# Patient Record
Sex: Female | Born: 1965
Health system: Southern US, Community
[De-identification: ages and names within clinical notes are randomized; demographics above are authoritative.]

## PROBLEM LIST (undated history)

## (undated) DIAGNOSIS — G4700 Insomnia, unspecified: Secondary | ICD-10-CM

## (undated) DIAGNOSIS — F0781 Postconcussional syndrome: Secondary | ICD-10-CM

## (undated) DIAGNOSIS — S069X9A Unspecified intracranial injury with loss of consciousness of unspecified duration, initial encounter: Secondary | ICD-10-CM

## (undated) DIAGNOSIS — H832X9 Labyrinthine dysfunction, unspecified ear: Secondary | ICD-10-CM

## (undated) DIAGNOSIS — I77819 Aortic ectasia, unspecified site: Secondary | ICD-10-CM

## (undated) DIAGNOSIS — E785 Hyperlipidemia, unspecified: Secondary | ICD-10-CM

## (undated) DIAGNOSIS — R112 Nausea with vomiting, unspecified: Secondary | ICD-10-CM

## (undated) DIAGNOSIS — K449 Diaphragmatic hernia without obstruction or gangrene: Secondary | ICD-10-CM

## (undated) DIAGNOSIS — G43909 Migraine, unspecified, not intractable, without status migrainosus: Secondary | ICD-10-CM

## (undated) DIAGNOSIS — K219 Gastro-esophageal reflux disease without esophagitis: Secondary | ICD-10-CM

## (undated) DIAGNOSIS — I10 Essential (primary) hypertension: Secondary | ICD-10-CM

## (undated) DIAGNOSIS — S069XAA Unspecified intracranial injury with loss of consciousness status unknown, initial encounter: Secondary | ICD-10-CM

## (undated) DIAGNOSIS — M5481 Occipital neuralgia: Secondary | ICD-10-CM

## (undated) DIAGNOSIS — G629 Polyneuropathy, unspecified: Secondary | ICD-10-CM

## (undated) DIAGNOSIS — F39 Unspecified mood [affective] disorder: Secondary | ICD-10-CM

## (undated) DIAGNOSIS — F4310 Post-traumatic stress disorder, unspecified: Secondary | ICD-10-CM

## (undated) DIAGNOSIS — H8190 Unspecified disorder of vestibular function, unspecified ear: Secondary | ICD-10-CM

## (undated) DIAGNOSIS — Z9889 Other specified postprocedural states: Secondary | ICD-10-CM

## (undated) DIAGNOSIS — R29898 Other symptoms and signs involving the musculoskeletal system: Secondary | ICD-10-CM

## (undated) DIAGNOSIS — T8859XA Other complications of anesthesia, initial encounter: Secondary | ICD-10-CM

## (undated) DIAGNOSIS — K589 Irritable bowel syndrome without diarrhea: Secondary | ICD-10-CM

## (undated) DIAGNOSIS — G4730 Sleep apnea, unspecified: Secondary | ICD-10-CM

## (undated) DIAGNOSIS — G43109 Migraine with aura, not intractable, without status migrainosus: Secondary | ICD-10-CM

## (undated) HISTORY — DX: Essential (primary) hypertension: I10

## (undated) HISTORY — PX: TONSILLECTOMY: SUR1361

## (undated) HISTORY — DX: Sleep apnea, unspecified: G47.30

## (undated) HISTORY — DX: Migraine with aura, not intractable, without status migrainosus: G43.109

## (undated) HISTORY — PX: COLONOSCOPY: SHX174

## (undated) HISTORY — DX: Unspecified disorder of vestibular function, unspecified ear: H81.90

## (undated) HISTORY — DX: Unspecified intracranial injury with loss of consciousness of unspecified duration, initial encounter: S06.9X9A

## (undated) HISTORY — PX: APPENDECTOMY: SHX54

## (undated) HISTORY — DX: Hyperlipidemia, unspecified: E78.5

## (undated) HISTORY — DX: Unspecified intracranial injury with loss of consciousness status unknown, initial encounter: S06.9XAA

## (undated) HISTORY — DX: Occipital neuralgia: M54.81

## (undated) HISTORY — DX: Diaphragmatic hernia without obstruction or gangrene: K44.9

## (undated) HISTORY — DX: Labyrinthine dysfunction, unspecified ear: H83.2X9

## (undated) HISTORY — DX: Polyneuropathy, unspecified: G62.9

## (undated) HISTORY — DX: Migraine, unspecified, not intractable, without status migrainosus: G43.909

## (undated) HISTORY — DX: Post-traumatic stress disorder, unspecified: F43.10

## (undated) HISTORY — DX: Unspecified mood (affective) disorder: F39

## (undated) HISTORY — DX: Aortic ectasia, unspecified site: I77.819

## (undated) HISTORY — DX: Irritable bowel syndrome, unspecified: K58.9

## (undated) HISTORY — DX: Other symptoms and signs involving the musculoskeletal system: R29.898

## (undated) HISTORY — DX: Postconcussional syndrome: F07.81

## (undated) HISTORY — DX: Gastro-esophageal reflux disease without esophagitis: K21.9

## (undated) HISTORY — PX: OTHER SURGICAL HISTORY: SHX169

## (undated) HISTORY — DX: Insomnia, unspecified: G47.00

## (undated) HISTORY — PX: UPPER GI ENDOSCOPY: SHX6162

---

## 1998-04-23 ENCOUNTER — Emergency Department (HOSPITAL_COMMUNITY): Admission: EM | Admit: 1998-04-23 | Discharge: 1998-04-23 | Payer: Self-pay | Admitting: *Deleted

## 1998-05-25 ENCOUNTER — Emergency Department (HOSPITAL_COMMUNITY): Admission: EM | Admit: 1998-05-25 | Discharge: 1998-05-25 | Payer: Self-pay | Admitting: Emergency Medicine

## 1999-09-10 ENCOUNTER — Encounter: Admission: RE | Admit: 1999-09-10 | Discharge: 1999-10-07 | Payer: Self-pay | Admitting: Internal Medicine

## 1999-09-22 ENCOUNTER — Emergency Department (HOSPITAL_COMMUNITY): Admission: EM | Admit: 1999-09-22 | Discharge: 1999-09-22 | Payer: Self-pay | Admitting: Emergency Medicine

## 1999-09-23 ENCOUNTER — Encounter: Payer: Self-pay | Admitting: Emergency Medicine

## 2000-09-07 ENCOUNTER — Other Ambulatory Visit: Admission: RE | Admit: 2000-09-07 | Discharge: 2000-09-07 | Payer: Self-pay | Admitting: Obstetrics and Gynecology

## 2000-09-30 ENCOUNTER — Ambulatory Visit (HOSPITAL_COMMUNITY): Admission: RE | Admit: 2000-09-30 | Discharge: 2000-09-30 | Payer: Self-pay | Admitting: Obstetrics and Gynecology

## 2000-09-30 ENCOUNTER — Encounter: Payer: Self-pay | Admitting: Obstetrics and Gynecology

## 2000-11-30 ENCOUNTER — Ambulatory Visit (HOSPITAL_COMMUNITY): Admission: RE | Admit: 2000-11-30 | Discharge: 2000-11-30 | Payer: Self-pay | Admitting: Gastroenterology

## 2000-11-30 ENCOUNTER — Encounter (INDEPENDENT_AMBULATORY_CARE_PROVIDER_SITE_OTHER): Payer: Self-pay | Admitting: *Deleted

## 2001-10-09 ENCOUNTER — Emergency Department (HOSPITAL_COMMUNITY): Admission: EM | Admit: 2001-10-09 | Discharge: 2001-10-09 | Payer: Self-pay | Admitting: Emergency Medicine

## 2001-12-14 ENCOUNTER — Other Ambulatory Visit: Admission: RE | Admit: 2001-12-14 | Discharge: 2001-12-14 | Payer: Self-pay | Admitting: Obstetrics and Gynecology

## 2003-01-23 ENCOUNTER — Other Ambulatory Visit: Admission: RE | Admit: 2003-01-23 | Discharge: 2003-01-23 | Payer: Self-pay | Admitting: Obstetrics and Gynecology

## 2004-02-01 ENCOUNTER — Encounter (INDEPENDENT_AMBULATORY_CARE_PROVIDER_SITE_OTHER): Payer: Self-pay | Admitting: *Deleted

## 2004-02-01 ENCOUNTER — Ambulatory Visit (HOSPITAL_COMMUNITY): Admission: RE | Admit: 2004-02-01 | Discharge: 2004-02-01 | Payer: Self-pay | Admitting: Gastroenterology

## 2004-12-13 ENCOUNTER — Emergency Department (HOSPITAL_COMMUNITY): Admission: EM | Admit: 2004-12-13 | Discharge: 2004-12-14 | Payer: Self-pay | Admitting: Emergency Medicine

## 2005-04-24 ENCOUNTER — Ambulatory Visit: Payer: Self-pay | Admitting: Internal Medicine

## 2005-07-07 ENCOUNTER — Ambulatory Visit: Payer: Self-pay | Admitting: Internal Medicine

## 2005-07-29 ENCOUNTER — Ambulatory Visit: Payer: Self-pay | Admitting: Internal Medicine

## 2005-08-28 ENCOUNTER — Ambulatory Visit: Payer: Self-pay | Admitting: Internal Medicine

## 2005-09-24 ENCOUNTER — Other Ambulatory Visit: Admission: RE | Admit: 2005-09-24 | Discharge: 2005-09-24 | Payer: Self-pay | Admitting: Obstetrics and Gynecology

## 2005-09-25 ENCOUNTER — Ambulatory Visit: Payer: Self-pay | Admitting: Internal Medicine

## 2005-12-25 ENCOUNTER — Ambulatory Visit: Payer: Self-pay | Admitting: Internal Medicine

## 2006-01-11 ENCOUNTER — Ambulatory Visit: Payer: Self-pay | Admitting: Internal Medicine

## 2006-01-12 ENCOUNTER — Ambulatory Visit: Payer: Self-pay | Admitting: Internal Medicine

## 2006-01-20 ENCOUNTER — Encounter: Admission: RE | Admit: 2006-01-20 | Discharge: 2006-01-20 | Payer: Self-pay | Admitting: Internal Medicine

## 2006-01-20 ENCOUNTER — Ambulatory Visit: Payer: Self-pay

## 2006-02-03 ENCOUNTER — Encounter: Admission: RE | Admit: 2006-02-03 | Discharge: 2006-02-03 | Payer: Self-pay | Admitting: Obstetrics and Gynecology

## 2006-03-08 ENCOUNTER — Ambulatory Visit: Payer: Self-pay | Admitting: Internal Medicine

## 2006-07-27 ENCOUNTER — Ambulatory Visit: Payer: Self-pay | Admitting: Internal Medicine

## 2006-09-18 ENCOUNTER — Ambulatory Visit: Payer: Self-pay | Admitting: Internal Medicine

## 2006-10-04 ENCOUNTER — Ambulatory Visit: Payer: Self-pay | Admitting: Internal Medicine

## 2006-12-17 ENCOUNTER — Ambulatory Visit: Payer: Self-pay | Admitting: Internal Medicine

## 2006-12-22 ENCOUNTER — Encounter: Admission: RE | Admit: 2006-12-22 | Discharge: 2007-01-25 | Payer: Self-pay | Admitting: Internal Medicine

## 2007-03-07 ENCOUNTER — Encounter: Admission: RE | Admit: 2007-03-07 | Discharge: 2007-03-07 | Payer: Self-pay | Admitting: Internal Medicine

## 2007-03-09 ENCOUNTER — Ambulatory Visit: Payer: Self-pay | Admitting: Internal Medicine

## 2007-06-21 ENCOUNTER — Encounter: Payer: Self-pay | Admitting: Internal Medicine

## 2007-07-07 ENCOUNTER — Telehealth: Payer: Self-pay | Admitting: Internal Medicine

## 2007-07-12 ENCOUNTER — Encounter: Payer: Self-pay | Admitting: *Deleted

## 2007-08-11 ENCOUNTER — Ambulatory Visit: Payer: Self-pay | Admitting: Internal Medicine

## 2007-08-15 ENCOUNTER — Ambulatory Visit: Payer: Self-pay | Admitting: Internal Medicine

## 2007-08-15 DIAGNOSIS — K589 Irritable bowel syndrome without diarrhea: Secondary | ICD-10-CM | POA: Insufficient documentation

## 2007-08-29 ENCOUNTER — Ambulatory Visit (HOSPITAL_COMMUNITY): Admission: RE | Admit: 2007-08-29 | Discharge: 2007-08-29 | Payer: Self-pay | Admitting: Obstetrics and Gynecology

## 2008-01-09 ENCOUNTER — Encounter: Payer: Self-pay | Admitting: Internal Medicine

## 2008-07-09 ENCOUNTER — Telehealth (INDEPENDENT_AMBULATORY_CARE_PROVIDER_SITE_OTHER): Payer: Self-pay | Admitting: *Deleted

## 2008-08-08 ENCOUNTER — Ambulatory Visit: Payer: Self-pay | Admitting: Internal Medicine

## 2008-08-08 DIAGNOSIS — F988 Other specified behavioral and emotional disorders with onset usually occurring in childhood and adolescence: Secondary | ICD-10-CM | POA: Insufficient documentation

## 2008-08-08 DIAGNOSIS — G4700 Insomnia, unspecified: Secondary | ICD-10-CM | POA: Insufficient documentation

## 2008-08-13 ENCOUNTER — Telehealth: Payer: Self-pay | Admitting: Internal Medicine

## 2008-08-23 ENCOUNTER — Ambulatory Visit: Payer: Self-pay | Admitting: Family Medicine

## 2008-08-29 ENCOUNTER — Ambulatory Visit (HOSPITAL_COMMUNITY): Admission: RE | Admit: 2008-08-29 | Discharge: 2008-08-29 | Payer: Self-pay | Admitting: Obstetrics and Gynecology

## 2008-09-01 ENCOUNTER — Ambulatory Visit: Payer: Self-pay | Admitting: Family Medicine

## 2008-09-01 DIAGNOSIS — J45909 Unspecified asthma, uncomplicated: Secondary | ICD-10-CM | POA: Insufficient documentation

## 2008-09-04 ENCOUNTER — Ambulatory Visit: Payer: Self-pay | Admitting: Family Medicine

## 2008-09-04 DIAGNOSIS — G43909 Migraine, unspecified, not intractable, without status migrainosus: Secondary | ICD-10-CM | POA: Insufficient documentation

## 2008-09-05 ENCOUNTER — Telehealth: Payer: Self-pay | Admitting: Family Medicine

## 2008-09-06 ENCOUNTER — Encounter: Payer: Self-pay | Admitting: Internal Medicine

## 2008-12-03 ENCOUNTER — Encounter: Payer: Self-pay | Admitting: Internal Medicine

## 2008-12-31 ENCOUNTER — Ambulatory Visit: Payer: Self-pay | Admitting: Internal Medicine

## 2008-12-31 DIAGNOSIS — F4321 Adjustment disorder with depressed mood: Secondary | ICD-10-CM | POA: Insufficient documentation

## 2008-12-31 DIAGNOSIS — L089 Local infection of the skin and subcutaneous tissue, unspecified: Secondary | ICD-10-CM | POA: Insufficient documentation

## 2009-01-09 ENCOUNTER — Telehealth: Payer: Self-pay | Admitting: Family Medicine

## 2009-01-10 ENCOUNTER — Ambulatory Visit: Payer: Self-pay | Admitting: Family Medicine

## 2009-01-10 DIAGNOSIS — M545 Low back pain, unspecified: Secondary | ICD-10-CM | POA: Insufficient documentation

## 2009-02-26 ENCOUNTER — Encounter: Payer: Self-pay | Admitting: Internal Medicine

## 2009-04-08 ENCOUNTER — Ambulatory Visit: Payer: Self-pay | Admitting: Internal Medicine

## 2009-04-08 DIAGNOSIS — H9190 Unspecified hearing loss, unspecified ear: Secondary | ICD-10-CM | POA: Insufficient documentation

## 2009-04-08 DIAGNOSIS — H9319 Tinnitus, unspecified ear: Secondary | ICD-10-CM | POA: Insufficient documentation

## 2009-04-09 ENCOUNTER — Encounter: Payer: Self-pay | Admitting: Internal Medicine

## 2009-04-09 ENCOUNTER — Telehealth: Payer: Self-pay | Admitting: Internal Medicine

## 2009-04-16 ENCOUNTER — Emergency Department (HOSPITAL_BASED_OUTPATIENT_CLINIC_OR_DEPARTMENT_OTHER): Admission: EM | Admit: 2009-04-16 | Discharge: 2009-04-17 | Payer: Self-pay | Admitting: Emergency Medicine

## 2009-04-18 ENCOUNTER — Encounter: Payer: Self-pay | Admitting: Internal Medicine

## 2009-04-25 ENCOUNTER — Ambulatory Visit: Payer: Self-pay | Admitting: Internal Medicine

## 2009-04-25 DIAGNOSIS — Z860100 Personal history of colon polyps, unspecified: Secondary | ICD-10-CM | POA: Insufficient documentation

## 2009-04-25 DIAGNOSIS — M26629 Arthralgia of temporomandibular joint, unspecified side: Secondary | ICD-10-CM | POA: Insufficient documentation

## 2009-04-30 ENCOUNTER — Encounter: Payer: Self-pay | Admitting: Internal Medicine

## 2009-05-08 ENCOUNTER — Telehealth: Payer: Self-pay | Admitting: *Deleted

## 2009-05-21 ENCOUNTER — Ambulatory Visit: Payer: Self-pay | Admitting: Internal Medicine

## 2009-05-21 DIAGNOSIS — T50995A Adverse effect of other drugs, medicaments and biological substances, initial encounter: Secondary | ICD-10-CM | POA: Insufficient documentation

## 2009-06-10 ENCOUNTER — Encounter: Payer: Self-pay | Admitting: Internal Medicine

## 2009-06-11 ENCOUNTER — Ambulatory Visit: Payer: Self-pay | Admitting: Internal Medicine

## 2009-06-11 DIAGNOSIS — F4323 Adjustment disorder with mixed anxiety and depressed mood: Secondary | ICD-10-CM | POA: Insufficient documentation

## 2009-07-02 ENCOUNTER — Encounter: Payer: Self-pay | Admitting: Internal Medicine

## 2009-07-12 ENCOUNTER — Encounter: Payer: Self-pay | Admitting: Internal Medicine

## 2009-07-19 ENCOUNTER — Telehealth: Payer: Self-pay | Admitting: *Deleted

## 2009-08-01 ENCOUNTER — Telehealth: Payer: Self-pay | Admitting: *Deleted

## 2009-08-14 ENCOUNTER — Telehealth: Payer: Self-pay | Admitting: Internal Medicine

## 2009-08-19 ENCOUNTER — Ambulatory Visit: Payer: Self-pay | Admitting: Family Medicine

## 2009-08-22 ENCOUNTER — Ambulatory Visit: Payer: Self-pay | Admitting: Family Medicine

## 2009-08-30 ENCOUNTER — Ambulatory Visit (HOSPITAL_COMMUNITY): Admission: RE | Admit: 2009-08-30 | Discharge: 2009-08-30 | Payer: Self-pay | Admitting: Obstetrics and Gynecology

## 2009-09-16 ENCOUNTER — Telehealth: Payer: Self-pay | Admitting: Internal Medicine

## 2009-10-09 ENCOUNTER — Ambulatory Visit: Payer: Self-pay | Admitting: Internal Medicine

## 2009-10-09 ENCOUNTER — Ambulatory Visit: Payer: Self-pay | Admitting: Cardiovascular Disease

## 2009-10-09 DIAGNOSIS — J329 Chronic sinusitis, unspecified: Secondary | ICD-10-CM | POA: Insufficient documentation

## 2009-10-14 ENCOUNTER — Telehealth: Payer: Self-pay | Admitting: *Deleted

## 2009-11-06 ENCOUNTER — Telehealth: Payer: Self-pay | Admitting: Internal Medicine

## 2009-11-07 ENCOUNTER — Encounter: Admission: RE | Admit: 2009-11-07 | Discharge: 2009-11-07 | Payer: Self-pay | Admitting: Internal Medicine

## 2009-11-11 ENCOUNTER — Telehealth: Payer: Self-pay | Admitting: *Deleted

## 2009-12-06 ENCOUNTER — Encounter: Payer: Self-pay | Admitting: *Deleted

## 2009-12-20 ENCOUNTER — Ambulatory Visit: Payer: Self-pay | Admitting: Internal Medicine

## 2009-12-23 ENCOUNTER — Ambulatory Visit: Payer: Self-pay | Admitting: Internal Medicine

## 2009-12-23 ENCOUNTER — Encounter: Payer: Self-pay | Admitting: *Deleted

## 2009-12-25 LAB — CONVERTED CEMR LAB
Mumps IgG: 1.46 — ABNORMAL HIGH
Rubella: >500 intl units/mL — ABNORMAL HIGH
Rubeola IgG: 2 — ABNORMAL HIGH

## 2010-01-20 ENCOUNTER — Ambulatory Visit: Payer: Self-pay | Admitting: Internal Medicine

## 2010-01-20 ENCOUNTER — Encounter: Payer: Self-pay | Admitting: *Deleted

## 2010-03-11 LAB — CONVERTED CEMR LAB: Pap Smear: NORMAL

## 2010-03-19 ENCOUNTER — Ambulatory Visit: Payer: Self-pay | Admitting: Internal Medicine

## 2010-03-21 ENCOUNTER — Ambulatory Visit: Payer: Self-pay | Admitting: Internal Medicine

## 2010-04-18 ENCOUNTER — Telehealth: Payer: Self-pay | Admitting: Internal Medicine

## 2010-04-19 ENCOUNTER — Ambulatory Visit: Payer: Self-pay | Admitting: Nurse Practitioner

## 2010-06-04 ENCOUNTER — Ambulatory Visit: Payer: Self-pay | Admitting: Internal Medicine

## 2010-06-04 ENCOUNTER — Telehealth: Payer: Self-pay | Admitting: *Deleted

## 2010-06-20 ENCOUNTER — Ambulatory Visit: Payer: Self-pay | Admitting: Internal Medicine

## 2010-06-30 ENCOUNTER — Encounter: Payer: Self-pay | Admitting: Internal Medicine

## 2010-07-21 ENCOUNTER — Ambulatory Visit: Payer: Self-pay | Admitting: Internal Medicine

## 2010-12-25 NOTE — Letter (Signed)
Summary: Immunization/Shot Record  [place] at [facility]  [address] [place], [place] [address]   Phone: [phone]  Fax: [phone]     Immunization Record for: Sylvia [name]  Vaccine 1 2 3 4 5 6  HepB Hepatitis B 12/20/2009      01/20/2010               DTP Diphtheria, Tetanus, Pertussis                         HIB Haemophilus influenzae Type b                 ZHYQMVHQIO IPV Inactivated Poliovirus             MMR Measles, Mumps, Jeanella Craze NGEXBMWUXL KGMWNUUVOZ Varicella Varivax    DGUYQIHKVQ QVZDGLOVFI EPPIRJJOAC Pneumococcal           Hep A Hepatitis A   Marguerite Olea ZYSAYTKZSW FUXNATFTDD UKGURKYHCW        Tetanus Booster Date of Last: Historical 07/08/2002  Flu Shot Date of Last: 08/08/2008 Pneumovax Date of Last:  Meningococcal Vaccine Given:       Other Vaccines HPV Vaccine/ Date of Last:    Vaccine/ Date of Last:    Vaccine/ Date of Last:     Marguerite Olea  CBJSEGBTDV  VOHYWVPXTG Rotavirus Vaccine/ Date of Last:    Vaccine/ Date of Last:    Vaccine/ Date of Last:     GYIRSWNIOE  [facility]  VOJJKKXFGH Zostavax Vaccine/ Date of Last:     Marguerite Olea  WEXHBZJIRC  Marguerite Olea  VELFYBOFBP  ZWCHENIDPO  Recommended Childhood and Adolescent Immunization Schedule [place]  2006 Vaccine Age Birth 1 mos 2 mos 4 mos 6 mos 12 mos 15 mos 18 mos 24 mos 4-6 yrs 11-12 yrs 13-14 yrs 15 yrs 16-18 yrs Hepatitis B1 HepB HepB HepB1  HepB  HepB Series Catch-Up Diphtheria, Tetanus, Pertussis2   DTaP DTaP DTaP   DTaP  DTaP Tdap  Tdap Catch-Up Haemophilus influenzae type b3   Hib Hib Hib3 Hib        Inactivated Poliovirus   IPV IPV  IPV   IPV     Measles, Mumps, Rubella4      MMR   MMR M MR MMR Catch-Up Varicella5       Varicella  Varicella  Catch-Up Meningo-coccal6           MCV4  MCV4 CatchUpV4           MPSV4 for High Risk Groups  C MCV4 for High Risk Groups Pneumo-coccal7   PCV PCV PCV PCV   PCV  Catch-Up PPV for High Risk Groups         PPV for High Risk Groups  Influenza8      Influenza (Yearly)  Influenza (Yearly) for High Risk Groups Hepatitis A9       HepA Series  This schedule indicates the recommended ages for routine administration of currently licensed childhood vaccines, as of October 23, 2004, for children through age 81 years. Any dose not administered at the recommended age should be administered at any subsequent visit when indicated and feasible. Indicates age groups that warrant special effort to administer those vaccines not previously administered. Additional vaccines may be licensed and recommended during the year. Licensed combination vaccines may be used whenever any components of the combination are indicated and other components of the vaccine are not contraindicated and if approved by the Food  and Drug Administration for that dose of the series. Providers should consult the respective ACIP statement for detailed recommendations. Clinically significant adverse events that follow immunization should be reported to the Vaccine Adverse Event Reporting System (VAERS). Guidance about how to obtain and complete a VAERS form is available at www.vaers.LAgents.no or by telephone, [phone].  The Childhood and Adolescent Immunization Schedule is approved by: Advisory Committee on Administrator http://www.wade.com/   American Academy of Pediatrics BridgeDigest.com.cy   [employer].[email or web address]
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
## 2010-12-25 NOTE — Letter (Signed)
Summary: Immunization/Shot Record  [place] at [facility]  [address] [place], [place] [address]   Phone: [phone]  Fax: [phone]     Immunization Record for: KANITA DELAGE  Vaccine 1 2 3 4 5 6  HepB Hepatitis B 12/20/2009                    DTP Diphtheria, Tetanus, Pertussis                         HIB Haemophilus influenzae Type b                 IONGEXBMWU IPV Inactivated Poliovirus             MMR Measles, Mumps, Jeanella Craze XLKGMWNUUV OZDGUYQIHK Varicella Varivax    VQQVZDGLOV FIEPPIRJJO ACZYSAYTKZ Pneumococcal           Hep A Hepatitis A   SWFUXNATFT DDUKGURKYH CWCBJSEGBT DVVOHYWVPX        Tetanus Booster Date of Last: Historical 07/08/2002  Flu Shot Date of Last: 08/08/2008 Pneumovax Date of Last:  Meningococcal Vaccine Given:       Other Vaccines HPV Vaccine/ Date of Last:    Vaccine/ Date of Last:    Vaccine/ Date of Last:     Marguerite Olea  TGGYIRSWNI  OEVOJJKKXF Rotavirus Vaccine/ Date of Last:    Vaccine/ Date of Last:    Vaccine/ Date of Last:     GHWEXHBZJI  [facility]  RCVELFYBOF Zostavax Vaccine/ Date of Last:     Marguerite Olea  BPZWCHENID  Marguerite Olea  POEUMPNTIR  WERXVQMGQQ  Recommended Childhood and Adolescent Immunization Schedule [place]  2006 Vaccine Age Birth 1 mos 2 mos 4 mos 6 mos 12 mos 15 mos 18 mos 24 mos 4-6 yrs 11-12 yrs 13-14 yrs 15 yrs 16-18 yrs Hepatitis B1 HepB HepB HepB1  HepB  HepB Series Catch-Up Diphtheria, Tetanus, Pertussis2   DTaP DTaP DTaP   DTaP  DTaP Tdap  Tdap Catch-Up Haemophilus influenzae type b3   Hib Hib Hib3 Hib        Inactivated Poliovirus   IPV IPV  IPV   IPV     Measles, Mumps, Rubella4      MMR   MMR M MR MMR Catch-Up Varicella5       Varicella  Varicella  Catch-Up Meningo-coccal6           MCV4  MCV4 CatchUpV4           MPSV4 for High Risk Groups  C MCV4 for High Risk Groups Pneumo-coccal7   PCV PCV PCV PCV   PCV  Catch-Up PPV for High Risk Groups         PPV for High Risk Groups  Influenza8      Influenza (Yearly)  Influenza (Yearly) for High Risk Groups Hepatitis A9       HepA Series  This schedule indicates the recommended ages for routine administration of currently licensed childhood vaccines, as of October 23, 2004, for children through age 68 years. Any dose not administered at the recommended age should be administered at any subsequent visit when indicated and feasible. Indicates age groups that warrant special effort to administer those vaccines not previously administered. Additional vaccines may be licensed and recommended during the year. Licensed combination vaccines may be used whenever any components of the combination are indicated and other components of the vaccine are not contraindicated and if approved by the Food and  Drug Administration for that dose of the series. Providers should consult the respective ACIP statement for detailed recommendations. Clinically significant adverse events that follow immunization should be reported to the Vaccine Adverse Event Reporting System (VAERS). Guidance about how to obtain and complete a VAERS form is available at www.vaers.LAgents.no or by telephone, [phone].  The Childhood and Adolescent Immunization Schedule is approved by: Advisory Committee on Administrator http://www.wade.com/   American Academy of Pediatrics BridgeDigest.com.cy   [employer].aafp.org

## 2010-12-25 NOTE — Progress Notes (Signed)
Summary: Pt req ear drops for an ear ache  Phone Note Call from Patient Call back at Home Phone [phone]   Caller: Patient Summary of Call: Pt called and has an ear ache that just started 2 hrs ago and is getting worse. Pt req an ear drop to be called in to [facility].  Initial call taken by: Lucy Antigua,  Apr 18, 2010 1:57 PM  Follow-up for Phone Call        if no fever or discharge and has no tube or  hole in her eardrom  can use auralgan drops 3-5  q 4 hour as needed.    disp 1   ? sat clinic  Follow-up by: Madelin Headings MD,  Apr 18, 2010 5:26 PM  Additional Follow-up for Phone Call Additional follow up Details #1::        Pt had some drainage from ear last night, and started with pain today.......temp 99.  Scheduled for saturday clinic. Additional Follow-up by: Lynann Beaver CMA,  Apr 18, 2010 5:37 PM

## 2010-12-25 NOTE — Medication Information (Signed)
Summary: Vyvanse Capsule Approved  Vyvanse Capsule Approved   Imported By: Maryln Gottron 07/03/2010 11:19:54  _____________________________________________________________________  External Attachment:    Type:   Image     Comment:   External Document

## 2010-12-25 NOTE — Miscellaneous (Signed)
Summary: Immunization Entry   Immunization History:  Tetanus/Td Immunization History:    Tetanus/Td:  historical (07/08/2002)

## 2010-12-25 NOTE — Assessment & Plan Note (Signed)
Summary: tb reading/njr 3.30pm   Nurse Visit   Allergies: No Known Drug Allergies  PPD Results    Date of reading: 12/23/2009    Results: < 5mm    Interpretation: negative

## 2010-12-25 NOTE — Assessment & Plan Note (Signed)
Summary: hep b inj/3.30pm   Nurse Visit   Allergies: No Known Drug Allergies  Immunizations Administered:  Hepatitis B Vaccine # 2:    Vaccine Type: HepB NB-47yrs    Site: left deltoid    Mfr: GlaxoSmithKline    Dose: 1.0 ml    Route: IM    Given by: Romualdo Bolk, CMA (AAMA)    Exp. Date: 01/24/2011    Lot #: [id number]  Orders Added: 1)  Hepatitis B Vaccine NB-3yrs [90744] 2)  Admin 1st Vaccine [75643]

## 2010-12-25 NOTE — Assessment & Plan Note (Signed)
Summary: INJ//ALP----PT [facility] // RS/pt rescd//ccm   Nurse Visit   Allergies: 1)  ! * Bee Stings  Immunizations Administered:  Hepatitis B Vaccine # 3:    Vaccine Type: HepB NB-18yrs    Site: left deltoid    Mfr: Merck    Dose: 1.0 ml    Route: IM    Given by: Romualdo Bolk, CMA (AAMA)    Exp. Date: 09/13/2012    Lot #: [id number]  Orders Added: 1)  Hepatitis B Vaccine NB-84yrs [[address]] 2)  Admin 1st Vaccine [60454]
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
## 2010-12-25 NOTE — Progress Notes (Signed)
Summary: Rx for adderall  Phone Note Call from Patient Call back at Home Phone [phone]   Caller: Patient Summary of Call: Pt wants to go back on Adderall to help with school. She took one when she was taking her EKG boards and states that it helped her focus more. So she would like to go back on it. Can we do this? Initial call taken by: Romualdo Bolk, CMA (AAMA),  June 04, 2010 10:16 AM  Follow-up for Phone Call        Probably    but OV to discuss  Follow-up by: Madelin Headings MD,  June 05, 2010 12:05 PM  Additional Follow-up for Phone Call Additional follow up Details #1::        Left message for pt to return my call Additional Follow-up by: Josph Macho RMA,  June 05, 2010 12:17 PM    Additional Follow-up for Phone Call Additional follow up Details #2::    Left message on machine to call back to schedule an office visit. Follow-up by: Romualdo Bolk, CMA (AAMA),  June 09, 2010 1:36 PM   Appended Document: Rx for adderall LM for pt to call back.   Appended Document: Rx for adderall Pt called back saying that she had this appt as inj but then called back to change it to a rov to go back on adderall. I told pt that I would have to reschedule this appt to a correct slot. 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 I told her that I would have to discuss this with Dr. Fabian Sharp and get back with her.

## 2010-12-25 NOTE — Assessment & Plan Note (Signed)
Summary: pt needs tb test and immunization for school, pt will bring l...   Nurse Visit   Allergies: No Known Drug Allergies  Immunizations Administered:  Hepatitis B Vaccine # 1:    Vaccine Type: HepB NB-2yrs    Site: right deltoid    Mfr: GlaxoSmithKline    Dose: 1.0 ml    Route: IM    Given by: Bethann Berkshire Murlene Revell,CMA (AAMA)    Exp. Date: 03/17/2011    Lot #: [id number]  PPD Skin Test:    Vaccine Type: PPD    Site: left forearm    Mfr: Sanofi Pasteur    Dose: 0.1 ml    Route: ID    Given by: Romualdo Bolk, CMA (AAMA)    Exp. Date: 04/19/2012    Lot #: [id number]  Orders Added: 1)  Venipuncture [62130] 2)  T-Measles ([place]) Antibody IgG [86578-46962] 3)  T-Rubella Antibody [95284-13244] 4)  T-Mumps Virus Antibody, IgG [01027-25366] 5)  T-Varicella-Zoster Antibody [44034-74259] 6)  Hepatitis B Vaccine NB-60yrs [90744] 7)  Admin 1st Vaccine [90471] 8)  TB Skin Test [56387] 9)  Admin of Any Addtl Vaccine [90472]  Appended Document: pt needs tb test and immunization for school, pt will bring l...   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 Complete immuinzation history taken and past review. Decisions made on what immunizations and labs tests were needed for her program. Also gave pt info on follow ups for immunizations. 20 mins spent on above  review and decision making.. Dr Fabian Sharp  consulted  on above.  Romualdo Bolk, CMA (AAMA)  December 20, 2009 5:07 PM     Clinical Lists Changes  Orders: Added new Service order of Form Completion [phone]) - Signed

## 2010-12-25 NOTE — Assessment & Plan Note (Signed)
Summary: TB READING/CB   Nurse Visit   Allergies: No Known Drug Allergies  PPD Results    Date of reading: 03/21/2010    Results: < 5mm    Interpretation: negative
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
## 2011-03-03 LAB — PREGNANCY, URINE: Preg Test, Ur: NEGATIVE

## 2011-04-10 NOTE — Procedures (Signed)
[facility]  Patient:    Sylvia [name], Sylvia [name]                        MRN: [id number] Proc. Date: 11/30/00 Adm. Date:  [id number] Attending:  Charna Elizabeth CC:         Neta Mends. Panosh, M.D. [facility]   Procedure Report  DATE OF BIRTH:  1966-07-07.  PROCEDURE PERFORMED:  Esophagogastroduodenoscopy.  ENDOSCOPIST:  Anselmo Rod, M.D.  INSTRUMENT USED:  Olympus video panendoscope.  INDICATIONS:  Epigastric pain and rectal bleeding in a 45 year old white female.  Rule out peptic ulcer disease, esophagitis, gastritis, etc.  PREPROCEDURE PREPARATION:  Informed consent was procured from the patient. The patient was fasted for 8 hours prior to the procedure.  PREPROCEDURE PHYSICAL:  Patient has stable vital signs.  NECK:  Supple.  CHEST:  Clear to auscultation. S1, S2 regular.  No murmur, rub or gallop, rales, rhonchi, wheezing.  ABDOMEN:  Soft with epigastric tenderness on palpation.  DESCRIPTION OF PROCEDURE:  The patient was placed in the left lateral decubitus position and sedated with 90 mg of Demerol and 8.5 mg of Versed intravenously.  Once the patient was adequately sedated and maintained on low-flow oxygen and continuous cardiac monitoring, the Olympus video panendoscope was advanced through the mouth piece, over the tongue into the esophagus under direct vision.  The entire esophagus appeared normal without evidence of rings, strictures, masses, lesions, esophagitis or Barretts mucosa.  The scope was then advanced into the stomach and the entire gastric mucosa appeared healthy and so did the proximal small bowel.  IMPRESSION:  Normal esophagogastroduodenoscopy.  RECOMMENDATIONS:  Proceed with colonoscopy at this time. DD:  12/01/00 TD:  12/01/00 Job: [id number]

## 2011-04-10 NOTE — Op Note (Signed)
[name], [name]                           ACCOUNT NO.:  [id number]   MEDICAL RECORD NO.:  [id number]                   PATIENT TYPE:  AMB   LOCATION:  ENDO                                 FACILITY:  [facility]   PHYSICIAN:  Anselmo Rod, M.D.               DATE OF BIRTH:  01-19-1966   DATE OF PROCEDURE:  02/01/2004  DATE OF DISCHARGE:                                 OPERATIVE REPORT   PROCEDURE PERFORMED:  Colonoscopy with biopsy x 1.   ENDOSCOPIST:  Anselmo Rod, M.D.   INSTRUMENT USED:  Olympus video colonoscope.   INDICATIONS FOR PROCEDURE:  A 45 year old white female with a history of a  tubulo adenoma removed in the past, undergoing a repeat colonoscopy to rule  out recurrent polyps.  The patient has had guaiac positive stools in the  recent past.   PREPROCEDURE PREPARATION:  Informed consent was procured from the patient.  The patient was fasted for 8 hours prior to the procedure and prepped with a  bottle of magnesium citrate and gallon of GoLYTELY the night prior to the  procedure.   PREPROCEDURE PHYSICAL EXAMINATION:  VITAL SIGNS:  The patient with stable  vital signs.  NECK:  Supple.  CHEST:  Clear to auscultation.  S1, S2.  Regular.  ABDOMEN:  Soft with normal bowel sounds.   DESCRIPTION OF PROCEDURE:  The patient was placed in left lateral decubitus  position, sedated with 75 mg of Demerol and 7.5 mg of Versed intravenously.  Once the patient was adequately sedated and maintained on low-flow oxygen  and continuous cardiac monitoring, the Olympus video colonoscope was  advanced from the rectum to the cecum and terminal ileum.  The appendiceal  orifice and ileocecal valve were clearly visualized and photographed.  A  small sessile polyp was biopsied from 10 cm.  The terminal ileum appeared  healthy without lesions.  No other abnormalities were noted.  Internal  hemorrhoids were seen on retroflexion in the rectum.   IMPRESSION:  1. Small, nonbleeding internal  hemorrhoids.  2. A small sessile polyp, biopsied from 10 cm.  3. Otherwise, normal-appearing proximal left colon, transverse colon, right     colon, cecum, and terminal ileum.   RECOMMENDATIONS:  1. Await pathology results.  2. Repeat guaiacs on an outpatient basis.  3. Repeat CRC screening depending on pathology results.  4. Outpatient follow up in the next 2 weeks for further recommendations.                                               Anselmo Rod, M.D.    JNM/MEDQ  D:  02/01/2004  T:  02/03/2004  Job:  [id number]   cc:   Neta Mends. Fabian Sharp, M.D. [facility]

## 2011-04-10 NOTE — Procedures (Signed)
. [facility]  Patient:    Sylvia [name], Sylvia [name]                        MRN: [id number] Proc. Date: 11/30/00 Adm. Date:  [id number] Attending:  Charna Elizabeth CC:         Neta Mends. Panosh, M.D. [facility]   Procedure Report  DATE OF BIRTH:  [birth year]  PROCEDURE PERFORMED:  Colonoscopy with polypectomy x 1.  ENDOSCOPIST:  Anselmo Rod, M.D.  INSTRUMENT USED:  Olympus video colonoscope.  INDICATION FOR PROCEDURE:  Rectal bleeding in a 45 year old white female. Rule out colonic polyps, masses, hemorrhoids, etc.  PREPROCEDURE PREPARATION:  Informed consent was procured from the patient. The patient was fasted for 8 hours prior to the procedure and prepped with a bottle of magnesium citrate and a gallon of NuLytely the night prior to the procedure.  PREPROCEDURE PHYSICAL:  Patient has stable vital signs.  NECK: Supple.  CHEST:  Clear to auscultation. S1, S2 regular.  ABDOMEN:  Soft with normal abdominal bowel sounds.  DESCRIPTION OF PROCEDURE:  The patient was placed in the left lateral decubitus position and sedated with an additional 10 mg of Demerol and 1.5 mg of Versed intravenously.  Patient received 90 of Demerol and 8.5 mg of Versed for EGD ______ the colonoscopy.  Once the patient was adequately sedated and maintained on low-flow oxygen and continuous cardiac monitoring, the Olympus video colonoscope was advanced from the rectum to the cecum and terminal ileum without difficulty.  A 3 mm sessile polyp was snared from 10 cm.  No other abnormalities were seen up to the terminal ileum.  The entire colonic mucosa appeared healthy with a normal vascular pattern.  No other masses, polyps, erosions, ulcerations, diverticula or AVMs were present.  IMPRESSION:  Normal colonoscopy except for a small 3 mm polyp snared from 10 cm.  RECOMMENDATIONS: 1. Avoid nonsteroidals. 2. Await pathology results. 3. Outpatient follow-up in the next two  weeks. DD:  12/01/00 TD:  12/01/00 Job: [id number]

## 2011-04-10 NOTE — Letter (Signed)
August 20, 2006     To Whom It May Concern:   RE:  Sylvia [name], Sylvia [name]  MRN:  [id number]  /  DOB:  1966-09-29   I am writing this letter at the request of Sylvia [name], who has been a  patient under my care since 1999.  She does have a history of childhood  diagnosed ADHD but had not been on medication for this predicament until  most recently when she has returned to school.  She is currently on  medication, Adderall-XR with some improvement in her ability to organize and  concentrate.   Please let us know if you require more information.   Sincerely,      Neta Mends. Fabian Sharp, MD   WKP/MedQ  DD:  08/20/2006  DT:  08/23/2006  Job #:  [id number]

## 2011-04-10 NOTE — Assessment & Plan Note (Signed)
[facility]                                   ON-CALL NOTE   Sylvia [name], Sylvia [name]                        MRN:          [id number]  DATE:06/27/2006                            DOB:          1966-05-25    She is my patient.   She had this somewhat sudden onset of two days ago of nausea and vomiting,  which had sweats and chills with questionable fever, diarrhea x1, but none  since then.  She feels awful, with associated dizziness, maybe a headache.  She has stopped all of her medicines for the last two days, because she  could not really keep it down.  She did try a pancake this morning, when it  was unsuccessful.  She is normally on Protonix for reflux and Effexor XR  150, planning to change over to Cymbalta for cost reasons.  However, she has  not taken her Effexor for two days.  She has had some burning in her chest,  secondary to reflux.  I discussed with her the importance of trying to get  the Effexor back in, as this may be aggravating her nausea and vomiting  symptoms, but it does not sound like an abdominal catastrophe or urgency.  We will call in Promethazine 25 mg suppositories, #6, to the [facility] at  [facility], [phone], #6, 25 mg one p.r. q. 4-6 hours p.r.n.  nausea.  Have her take one and then try small sips of fluid and get the  Effexor 150 down, and then the rest p.o. as tolerated.  I can see her in the  office or she can call tomorrow, otherwise call back, or if getting  dehydrated,  go to the emergency room for IV fluids.                                   Neta Mends. Fabian Sharp, MD   WKP/MedQ  DD:  06/27/2006  DT:  06/27/2006  Job #:  [id number]

## 2012-11-23 HISTORY — PX: OTHER SURGICAL HISTORY: SHX169

## 2019-06-21 ENCOUNTER — Telehealth: Payer: Self-pay

## 2019-06-21 NOTE — Telephone Encounter (Signed)
  Please advise  Copied from [place] [phone]. Topic: Appointment Scheduling - Scheduling Inquiry for Clinic >> Jun 20, 2019 10:09 AM Lennox Solders wrote: Reason for CRM: pt is moving back to [place] and would like to re-est wit dr Regis Bill >> Jun 20, 2019 10:11 AM Lennox Solders wrote: Pt moving back to [place]

## 2019-06-26 NOTE — Telephone Encounter (Signed)
Hope you are doing well  And welcome back to ....   because of needing to limit patient panel  I am not taking on  "new" patients.  Please Consider seeing one of the other providers in our [place] field   group that are taking new patients  I would recommend any of them .

## 2019-06-26 NOTE — Telephone Encounter (Signed)
lvm for pt to call back crm created

## 2019-06-26 NOTE — Telephone Encounter (Signed)
Pt notified per carol

## 2019-08-07 DIAGNOSIS — G43719 Chronic migraine without aura, intractable, without status migrainosus: Secondary | ICD-10-CM | POA: Diagnosis not present

## 2019-08-07 DIAGNOSIS — Z049 Encounter for examination and observation for unspecified reason: Secondary | ICD-10-CM | POA: Diagnosis not present

## 2019-08-07 DIAGNOSIS — G43119 Migraine with aura, intractable, without status migrainosus: Secondary | ICD-10-CM | POA: Diagnosis not present

## 2019-08-07 DIAGNOSIS — G43839 Menstrual migraine, intractable, without status migrainosus: Secondary | ICD-10-CM | POA: Diagnosis not present

## 2019-08-08 DIAGNOSIS — M542 Cervicalgia: Secondary | ICD-10-CM | POA: Diagnosis not present

## 2019-08-08 DIAGNOSIS — G518 Other disorders of facial nerve: Secondary | ICD-10-CM | POA: Diagnosis not present

## 2019-08-08 DIAGNOSIS — G43119 Migraine with aura, intractable, without status migrainosus: Secondary | ICD-10-CM | POA: Diagnosis not present

## 2019-08-08 DIAGNOSIS — G43719 Chronic migraine without aura, intractable, without status migrainosus: Secondary | ICD-10-CM | POA: Diagnosis not present

## 2019-08-08 DIAGNOSIS — M791 Myalgia, unspecified site: Secondary | ICD-10-CM | POA: Diagnosis not present

## 2019-08-08 DIAGNOSIS — G43839 Menstrual migraine, intractable, without status migrainosus: Secondary | ICD-10-CM | POA: Diagnosis not present

## 2019-08-17 DIAGNOSIS — K219 Gastro-esophageal reflux disease without esophagitis: Secondary | ICD-10-CM | POA: Diagnosis not present

## 2019-08-17 DIAGNOSIS — K429 Umbilical hernia without obstruction or gangrene: Secondary | ICD-10-CM | POA: Diagnosis not present

## 2019-08-17 DIAGNOSIS — Z1211 Encounter for screening for malignant neoplasm of colon: Secondary | ICD-10-CM | POA: Diagnosis not present

## 2019-08-17 DIAGNOSIS — K8020 Calculus of gallbladder without cholecystitis without obstruction: Secondary | ICD-10-CM | POA: Diagnosis not present

## 2019-08-22 DIAGNOSIS — G518 Other disorders of facial nerve: Secondary | ICD-10-CM | POA: Diagnosis not present

## 2019-08-22 DIAGNOSIS — M542 Cervicalgia: Secondary | ICD-10-CM | POA: Diagnosis not present

## 2019-08-22 DIAGNOSIS — M791 Myalgia, unspecified site: Secondary | ICD-10-CM | POA: Diagnosis not present

## 2019-08-22 DIAGNOSIS — G43839 Menstrual migraine, intractable, without status migrainosus: Secondary | ICD-10-CM | POA: Diagnosis not present

## 2019-08-22 DIAGNOSIS — G43119 Migraine with aura, intractable, without status migrainosus: Secondary | ICD-10-CM | POA: Diagnosis not present

## 2019-08-22 DIAGNOSIS — G43719 Chronic migraine without aura, intractable, without status migrainosus: Secondary | ICD-10-CM | POA: Diagnosis not present

## 2019-09-06 DIAGNOSIS — G43119 Migraine with aura, intractable, without status migrainosus: Secondary | ICD-10-CM | POA: Diagnosis not present

## 2019-09-06 DIAGNOSIS — G43839 Menstrual migraine, intractable, without status migrainosus: Secondary | ICD-10-CM | POA: Diagnosis not present

## 2019-09-06 DIAGNOSIS — G43719 Chronic migraine without aura, intractable, without status migrainosus: Secondary | ICD-10-CM | POA: Diagnosis not present

## 2019-09-22 ENCOUNTER — Other Ambulatory Visit: Payer: Self-pay | Admitting: General Practice

## 2019-09-22 ENCOUNTER — Other Ambulatory Visit: Payer: Self-pay | Admitting: Internal Medicine

## 2019-09-22 DIAGNOSIS — R911 Solitary pulmonary nodule: Secondary | ICD-10-CM

## 2019-09-26 ENCOUNTER — Other Ambulatory Visit: Payer: Self-pay

## 2019-09-26 ENCOUNTER — Ambulatory Visit
Admission: RE | Admit: 2019-09-26 | Discharge: 2019-09-26 | Disposition: A | Discharge status: Home / Self Care | Payer: BC Managed Care – PPO | Source: Ambulatory Visit | Attending: Internal Medicine | Admitting: Internal Medicine

## 2019-09-26 DIAGNOSIS — J8410 Pulmonary fibrosis, unspecified: Secondary | ICD-10-CM | POA: Diagnosis not present

## 2019-09-26 DIAGNOSIS — R911 Solitary pulmonary nodule: Secondary | ICD-10-CM

## 2019-09-27 DIAGNOSIS — R1013 Epigastric pain: Secondary | ICD-10-CM | POA: Diagnosis not present

## 2019-09-27 DIAGNOSIS — K635 Polyp of colon: Secondary | ICD-10-CM | POA: Diagnosis not present

## 2019-09-27 DIAGNOSIS — D122 Benign neoplasm of ascending colon: Secondary | ICD-10-CM | POA: Diagnosis not present

## 2019-09-27 DIAGNOSIS — K319 Disease of stomach and duodenum, unspecified: Secondary | ICD-10-CM | POA: Diagnosis not present

## 2019-09-27 DIAGNOSIS — K2100 Gastro-esophageal reflux disease with esophagitis, without bleeding: Secondary | ICD-10-CM | POA: Diagnosis not present

## 2019-09-27 DIAGNOSIS — D125 Benign neoplasm of sigmoid colon: Secondary | ICD-10-CM | POA: Diagnosis not present

## 2019-09-27 DIAGNOSIS — K317 Polyp of stomach and duodenum: Secondary | ICD-10-CM | POA: Diagnosis not present

## 2019-09-27 DIAGNOSIS — K621 Rectal polyp: Secondary | ICD-10-CM | POA: Diagnosis not present

## 2019-09-27 DIAGNOSIS — Z1211 Encounter for screening for malignant neoplasm of colon: Secondary | ICD-10-CM | POA: Diagnosis not present

## 2019-10-10 DIAGNOSIS — K8020 Calculus of gallbladder without cholecystitis without obstruction: Secondary | ICD-10-CM | POA: Diagnosis not present

## 2019-10-10 DIAGNOSIS — M542 Cervicalgia: Secondary | ICD-10-CM | POA: Diagnosis not present

## 2019-10-10 DIAGNOSIS — F418 Other specified anxiety disorders: Secondary | ICD-10-CM | POA: Diagnosis not present

## 2019-10-10 DIAGNOSIS — G4489 Other headache syndrome: Secondary | ICD-10-CM | POA: Diagnosis not present

## 2019-10-10 DIAGNOSIS — K449 Diaphragmatic hernia without obstruction or gangrene: Secondary | ICD-10-CM | POA: Diagnosis not present

## 2019-10-10 DIAGNOSIS — K219 Gastro-esophageal reflux disease without esophagitis: Secondary | ICD-10-CM | POA: Diagnosis not present

## 2019-10-10 DIAGNOSIS — I77810 Thoracic aortic ectasia: Secondary | ICD-10-CM | POA: Diagnosis not present

## 2019-10-10 DIAGNOSIS — Z86010 Personal history of colonic polyps: Secondary | ICD-10-CM | POA: Diagnosis not present

## 2019-10-18 ENCOUNTER — Encounter: Payer: Self-pay | Admitting: *Deleted

## 2019-10-23 ENCOUNTER — Encounter

## 2019-10-23 ENCOUNTER — Encounter: Payer: Self-pay | Admitting: Neurology

## 2019-10-23 ENCOUNTER — Other Ambulatory Visit: Payer: Self-pay

## 2019-10-23 ENCOUNTER — Ambulatory Visit: Payer: BC Managed Care – PPO | Admitting: Neurology

## 2019-10-23 VITALS — BP 114/81 | HR 83 | Temp 97.2°F | Ht 61.0 in | Wt 175.0 lb

## 2019-10-23 DIAGNOSIS — S069X9A Unspecified intracranial injury with loss of consciousness of unspecified duration, initial encounter: Secondary | ICD-10-CM | POA: Insufficient documentation

## 2019-10-23 DIAGNOSIS — G43711 Chronic migraine without aura, intractable, with status migrainosus: Secondary | ICD-10-CM

## 2019-10-23 DIAGNOSIS — R42 Dizziness and giddiness: Secondary | ICD-10-CM | POA: Diagnosis not present

## 2019-10-23 DIAGNOSIS — F0781 Postconcussional syndrome: Secondary | ICD-10-CM

## 2019-10-23 DIAGNOSIS — F419 Anxiety disorder, unspecified: Secondary | ICD-10-CM

## 2019-10-23 DIAGNOSIS — F329 Major depressive disorder, single episode, unspecified: Secondary | ICD-10-CM

## 2019-10-23 DIAGNOSIS — F32A Depression, unspecified: Secondary | ICD-10-CM | POA: Insufficient documentation

## 2019-10-23 MED ORDER — NURTEC 75 MG PO TBDP: 75.0000 mg | ORAL_TABLET | Freq: Every day | ORAL | 6 refills | Status: AC | PRN

## 2019-10-23 NOTE — Patient Instructions (Addendum)
Recommend Sylvia [name] in psychatry, [facility]  Continue botox for migraine prevention Continue relpax Try Nurtec for migraines Recommend layering Ajovy or vyepti on top of the botox Vestibular therapy   [employer]: Patient drug information Access [employer] Online here. Copyright [phone] [employer] All rights reserved. (For additional information see "[employer]: Drug information") Brand Names: [place]  Nurtec  What is this drug used for?   It is used to treat migraine headaches.  What do I need to tell my doctor BEFORE I take this drug?   If you are allergic to this drug; any part of this drug; or any other drugs, foods, or substances. Tell your doctor about the allergy and what signs you had.   If you have any of these health problems: Kidney disease or liver disease.   If you take any drugs (prescription or OTC, natural products, vitamins) that must not be taken with this drug, like certain drugs that are used for HIV, infections, or seizures. There are many drugs that must not be taken with this drug.   This is not a list of all drugs or health problems that interact with this drug.   Tell your doctor and pharmacist about all of your drugs (prescription or OTC, natural products, vitamins) and health problems. You must check to make sure that it is safe for you to take this drug with all of your drugs and health problems. Do not start, stop, or change the dose of any drug without checking with your doctor.  What are some things I need to know or do while I take this drug?   Tell all of your health care providers that you take this drug. This includes your doctors, nurses, pharmacists, and dentists.   This drug is not meant to prevent or lower the number of migraine headaches you get.   Tell your doctor if you are pregnant, plan on getting pregnant, or are breast-feeding. You will need to talk about the benefits and risks to you and the baby.  What are some side effects that I need to  call my doctor about right away?   WARNING/CAUTION: Even though it may be rare, some people may have very bad and sometimes deadly side effects when taking a drug. Tell your doctor or get medical help right away if you have any of the following signs or symptoms that may be related to a very bad side effect:   Signs of an allergic reaction, like rash; hives; itching; red, swollen, blistered, or peeling skin with or without fever; wheezing; tightness in the chest or throat; trouble breathing, swallowing, or talking; unusual hoarseness; or swelling of the mouth, face, lips, tongue, or throat.  What are some other side effects of this drug?   All drugs may cause side effects. However, many people have no side effects or only have minor side effects. Call your doctor or get medical help if any of these side effects or any other side effects bother you or do not go away:   Upset stomach.   These are not all of the side effects that may occur. If you have questions about side effects, call your doctor. Call your doctor for medical advice about side effects.   You may report side effects to your national health agency.  How is this drug best taken?   Use this drug as ordered by your doctor. Read all information given to you. Follow all instructions closely.   Do not push the tablet out  of the foil when opening. Use dry hands to take it from the foil. Place on your tongue and let it dissolve. Water is not needed. Do not swallow it whole. Do not chew, break, or crush it.   If needed, you may place the tablet under the tongue.   Use right after opening.  What do I do if I miss a dose?   This drug is taken on an as needed basis. Do not take more often than told by the doctor.  How do I store and/or throw out this drug?   Store at room temperature in a dry place. Do not store in a bathroom.   Store in foil pouch until ready for use.   Keep all drugs in a safe place. Keep all drugs out of the reach of  children and pets.   Throw away unused or expired drugs. Do not flush down a toilet or pour down a drain unless you are told to do so. Check with your pharmacist if you have questions about the best way to throw out drugs. There may be drug take-back programs in your area.  General drug facts   If your symptoms or health problems do not get better or if they become worse, call your doctor.   Do not share your drugs with others and do not take anyone else's drugs.   Some drugs may have another patient information leaflet. If you have any questions about this drug, please talk with your doctor, nurse, pharmacist, or other health care provider.   If you think there has been an overdose, call your poison control center or get medical care right away. Be ready to tell or show what was taken, how much, and when it happened.
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
## 2019-10-25 ENCOUNTER — Other Ambulatory Visit: Payer: Self-pay | Admitting: Internal Medicine

## 2019-10-25 ENCOUNTER — Ambulatory Visit: Payer: BC Managed Care – PPO

## 2019-10-25 ENCOUNTER — Other Ambulatory Visit: Payer: Self-pay

## 2019-10-25 ENCOUNTER — Telehealth: Payer: Self-pay | Admitting: *Deleted

## 2019-10-25 ENCOUNTER — Ambulatory Visit
Admission: RE | Admit: 2019-10-25 | Discharge: 2019-10-25 | Disposition: A | Discharge status: Home / Self Care | Payer: BC Managed Care – PPO | Source: Ambulatory Visit | Attending: Internal Medicine | Admitting: Internal Medicine

## 2019-10-25 DIAGNOSIS — G8929 Other chronic pain: Secondary | ICD-10-CM

## 2019-10-25 DIAGNOSIS — M542 Cervicalgia: Secondary | ICD-10-CM

## 2019-10-25 NOTE — Telephone Encounter (Signed)
Received pa request [facility]. I requested pharmacy benefit info. Per pharmacy:   Kara Dies: [id number] PCN: [id number] [id number] Born ID: [id number]  Completed Nurtec PA on CMM. Key: [id number]. CaseId: [id number];DDUKGU: Approved;Review Type: Prior Auth;Coverage Start Date: 09/25/2019;Coverage End Date: 10/24/2020;   I faxed a notification of approval to pt's pharmacy. Received a receipt of confirmation.

## 2019-10-27 DIAGNOSIS — K8010 Calculus of gallbladder with chronic cholecystitis without obstruction: Secondary | ICD-10-CM | POA: Diagnosis not present

## 2019-10-27 DIAGNOSIS — K449 Diaphragmatic hernia without obstruction or gangrene: Secondary | ICD-10-CM | POA: Diagnosis not present

## 2019-11-01 ENCOUNTER — Other Ambulatory Visit: Payer: Self-pay | Admitting: General Surgery

## 2019-11-01 DIAGNOSIS — K449 Diaphragmatic hernia without obstruction or gangrene: Secondary | ICD-10-CM

## 2019-11-07 DIAGNOSIS — E782 Mixed hyperlipidemia: Secondary | ICD-10-CM | POA: Diagnosis not present

## 2019-11-07 DIAGNOSIS — Z23 Encounter for immunization: Secondary | ICD-10-CM | POA: Diagnosis not present

## 2019-11-07 DIAGNOSIS — R0789 Other chest pain: Secondary | ICD-10-CM | POA: Diagnosis not present

## 2019-11-07 DIAGNOSIS — I77810 Thoracic aortic ectasia: Secondary | ICD-10-CM | POA: Diagnosis not present

## 2019-11-07 DIAGNOSIS — M542 Cervicalgia: Secondary | ICD-10-CM | POA: Diagnosis not present

## 2019-11-08 ENCOUNTER — Ambulatory Visit
Admission: RE | Admit: 2019-11-08 | Discharge: 2019-11-08 | Disposition: A | Discharge status: Home / Self Care | Payer: BC Managed Care – PPO | Source: Ambulatory Visit | Attending: General Surgery | Admitting: General Surgery

## 2019-11-08 DIAGNOSIS — K449 Diaphragmatic hernia without obstruction or gangrene: Secondary | ICD-10-CM

## 2019-11-09 ENCOUNTER — Ambulatory Visit: Payer: Self-pay | Admitting: General Surgery

## 2019-11-13 DIAGNOSIS — Z209 Contact with and (suspected) exposure to unspecified communicable disease: Secondary | ICD-10-CM | POA: Diagnosis not present

## 2019-11-13 DIAGNOSIS — M792 Neuralgia and neuritis, unspecified: Secondary | ICD-10-CM | POA: Diagnosis not present

## 2019-11-14 ENCOUNTER — Telehealth: Payer: Self-pay | Admitting: Neurology

## 2019-11-14 DIAGNOSIS — Z209 Contact with and (suspected) exposure to unspecified communicable disease: Secondary | ICD-10-CM | POA: Diagnosis not present

## 2019-11-14 DIAGNOSIS — R0789 Other chest pain: Secondary | ICD-10-CM | POA: Diagnosis not present

## 2019-11-14 DIAGNOSIS — E782 Mixed hyperlipidemia: Secondary | ICD-10-CM | POA: Diagnosis not present

## 2019-11-14 DIAGNOSIS — Z131 Encounter for screening for diabetes mellitus: Secondary | ICD-10-CM | POA: Diagnosis not present

## 2019-11-14 NOTE — Telephone Encounter (Signed)
[facility] is calling in stating patient is requesting botox prescription to be faxed over to them to be filled.

## 2019-11-14 NOTE — Telephone Encounter (Signed)
Patient called stating that she was advised by Dr.Ahern a prescription for botox would be sent to the pharmacy for her. Informed message has been put in and RN will FU  Please follow up.

## 2019-11-15 DIAGNOSIS — K8010 Calculus of gallbladder with chronic cholecystitis without obstruction: Secondary | ICD-10-CM | POA: Diagnosis not present

## 2019-11-15 DIAGNOSIS — K2100 Gastro-esophageal reflux disease with esophagitis, without bleeding: Secondary | ICD-10-CM | POA: Diagnosis not present

## 2019-11-15 DIAGNOSIS — Z1159 Encounter for screening for other viral diseases: Secondary | ICD-10-CM | POA: Diagnosis not present

## 2019-11-15 NOTE — Telephone Encounter (Signed)
I called to schedule the patient and inquire if her insurance is changing in [place]. She did not answer so I left a VM asking her to call me back. DW

## 2019-11-15 NOTE — Telephone Encounter (Signed)
Pt returned call. Please call back when available. 

## 2019-11-21 ENCOUNTER — Ambulatory Visit: Payer: BC Managed Care – PPO | Attending: Neurology | Admitting: Physical Therapy

## 2019-11-21 ENCOUNTER — Other Ambulatory Visit: Payer: Self-pay

## 2019-11-21 ENCOUNTER — Encounter: Payer: Self-pay | Admitting: Physical Therapy

## 2019-11-21 DIAGNOSIS — R42 Dizziness and giddiness: Secondary | ICD-10-CM | POA: Insufficient documentation

## 2019-11-21 DIAGNOSIS — R2681 Unsteadiness on feet: Secondary | ICD-10-CM | POA: Diagnosis not present

## 2019-11-21 DIAGNOSIS — R2689 Other abnormalities of gait and mobility: Secondary | ICD-10-CM | POA: Insufficient documentation

## 2019-11-21 NOTE — Telephone Encounter (Signed)
Patient came to the front desk stating she needed an apt and to call [facility] . [phone] [facility].  I called them and gave verbal for her Botox.  Romelle Starcher will you call patient to see where you can schedule her in January .  Approval may 48 hours or a little linger because of the holiday.

## 2019-11-21 NOTE — Telephone Encounter (Signed)
Spoke with Dr. Jaynee Eagles. Ok to see Sylvia [name] for botox. Can also cancel the March f/u with Dr. Jaynee Eagles.  I called pt and LVM asking for call back to schedule appt. Left office number in message.

## 2019-11-21 NOTE — Telephone Encounter (Signed)
I called the pt. Scheduled her for next available botox on 12/11/19 @ 9:00 AM arrival 8:45 AM. Pt also placed on wait list in case of sooner opening.   Per Dr. Jaynee Eagles, don't cancel the March appt with her.
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
## 2019-11-21 NOTE — Patient Instructions (Addendum)
Gaze Stabilization: Sitting    Keeping eyes on target on wall \\_10N  feet away, tilt head down 15-30 and move head side to side for 10-30 seconds. Repeat while moving head up and down for _30___ seconds. Do _3-5___ sessions per day. Repeat using target on pattern background.

## 2019-11-21 NOTE — Telephone Encounter (Signed)
Patient called back to schedule her botox apt please FU

## 2019-12-05 ENCOUNTER — Telehealth: Payer: Self-pay

## 2019-12-05 NOTE — Telephone Encounter (Signed)
Patient has authorization on file from previous doctor, this must be cancelled by previous office. They need to call [phone].DWD   I called and spoke with the patient who stated that the previous office already knows they are supposed to cancel the authorization, she had already made them aware. She feels they are intentionally not doing this. She asked me to call them and ask them to cancel it. I called their office at [phone] and was transferred to Cindy's VM.
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
## 2019-12-07 ENCOUNTER — Other Ambulatory Visit: Payer: Self-pay

## 2019-12-07 ENCOUNTER — Ambulatory Visit: Payer: BC Managed Care – PPO | Admitting: Cardiology

## 2019-12-07 ENCOUNTER — Encounter: Payer: Self-pay | Admitting: Cardiology

## 2019-12-07 VITALS — BP 122/88 | HR 78 | Temp 98.0°F | Ht 61.0 in | Wt 173.3 lb

## 2019-12-07 DIAGNOSIS — Z905 Acquired absence of kidney: Secondary | ICD-10-CM

## 2019-12-07 DIAGNOSIS — R0789 Other chest pain: Secondary | ICD-10-CM

## 2019-12-07 DIAGNOSIS — I77810 Thoracic aortic ectasia: Secondary | ICD-10-CM

## 2019-12-07 DIAGNOSIS — I700 Atherosclerosis of aorta: Secondary | ICD-10-CM

## 2019-12-07 DIAGNOSIS — E7800 Pure hypercholesterolemia, unspecified: Secondary | ICD-10-CM

## 2019-12-07 DIAGNOSIS — Z8249 Family history of ischemic heart disease and other diseases of the circulatory system: Secondary | ICD-10-CM

## 2019-12-07 NOTE — Telephone Encounter (Signed)
I called the [facility] back again and left another VM for [facility]. DWD

## 2019-12-07 NOTE — Telephone Encounter (Signed)
I called the patient to let her know we had not heard back from Dr. Nanine Means office. She stated that she placed an order with [facility] for her Botox medication and they were going to deliver it to our office under our script. I told her I would look for the medication to arrive today. DWD

## 2019-12-07 NOTE — Telephone Encounter (Signed)
Arline Asp called back and stated she had spoken with the insurance and released the PA. [id number].

## 2019-12-08 ENCOUNTER — Ambulatory Visit: Payer: BC Managed Care – PPO

## 2019-12-10 NOTE — Progress Notes (Signed)
Consent Form Botulism Toxin Injection For Chronic Migraine  This is her first Botox procedure with [facility]. Previously seen by Dr Neale Burly and neurologist in [place] prior to that. Reports getting temporal injections with Botox due to clinching. Not on any other preventative therapy. Tried and failed multiple medications in the past. Using Nurtec and Relax for abortive therapy.   Reviewed orally with patient, additionally signature is on file:  Botulism toxin has been approved by the Federal drug administration for treatment of chronic migraine. Botulism toxin does not cure chronic migraine and it may not be effective in some patients.  The administration of botulism toxin is accomplished by injecting a small amount of toxin into the muscles of the neck and head. Dosage must be titrated for each individual. Any benefits resulting from botulism toxin tend to wear off after 3 months with a repeat injection required if benefit is to be maintained. Injections are usually done every 3-4 months with maximum effect peak achieved by about 2 or 3 weeks. Botulism toxin is expensive and you should be sure of what costs you will incur resulting from the injection.  The side effects of botulism toxin use for chronic migraine may include:   -Transient, and usually mild, facial weakness with facial injections  -Transient, and usually mild, head or neck weakness with head/neck injections  -Reduction or loss of forehead facial animation due to forehead muscle weakness  -Eyelid drooping  -Dry eye  -Pain at the site of injection or bruising at the site of injection  -Double vision  -Potential unknown long term risks   Contraindications: You should not have Botox if you are pregnant, nursing, allergic to albumin, have an infection, skin condition, or muscle weakness at the site of the injection, or have myasthenia gravis, Lambert-Eaton syndrome, or ALS.  It is also possible that as with any injection, there may be an  allergic reaction or no effect from the medication. Reduced effectiveness after repeated injections is sometimes seen and rarely infection at the injection site may occur. All care will be taken to prevent these side effects. If therapy is given over a long time, atrophy and wasting in the muscle injected may occur. Occasionally the patient's become refractory to treatment because they develop antibodies to the toxin. In this event, therapy needs to be modified.  I have read the above information and consent to the administration of botulism toxin.    BOTOX PROCEDURE NOTE FOR MIGRAINE HEADACHE  Contraindications and precautions discussed with patient(above). Aseptic procedure was observed and patient tolerated procedure. Procedure performed by Shawnie Dapper, FNP-C.   The condition has existed for more than 6 months, and pt does not have a diagnosis of ALS, Myasthenia Gravis or Lambert-Eaton Syndrome.  Risks and benefits of injections discussed and pt agrees to proceed with the procedure.  Written consent obtained  These injections are medically necessary. Pt  receives good benefits from these injections. These injections do not cause sedations or hallucinations which the oral therapies may cause.   Description of procedure:  The patient was placed in a sitting position. The standard protocol was used for Botox as follows, with 5 units of Botox injected at each site:  -Procerus muscle, midline injection  -Corrugator muscle, bilateral injection  -Frontalis muscle, bilateral injection, with 2 sites each side, medial injection was performed in the upper one third of the frontalis muscle, in the region vertical from the medial inferior edge of the superior orbital rim. The lateral injection was again  in the upper one third of the forehead vertically above the lateral limbus of the cornea, 1.5 cm lateral to the medial injection site.  -Temporalis muscle injection, 4 sites, bilaterally. The first  injection was 3 cm above the tragus of the ear, second injection site was 1.5 cm to 3 cm up from the first injection site in line with the tragus of the ear. The third injection site was 1.5-3 cm forward between the first 2 injection sites. The fourth injection site was 1.5 cm posterior to the second injection site. 5th site laterally in the temporalis  muscleat the level of the outer canthus.  -Occipitalis muscle injection, 3 sites, bilaterally. The first injection was done one half way between the occipital protuberance and the tip of the mastoid process behind the ear. The second injection site was done lateral and superior to the first, 1 fingerbreadth from the first injection. The third injection site was 1 fingerbreadth superiorly and medially from the first injection site.  -Cervical paraspinal muscle injection, 2 sites, bilateral knee first injection site was 1 cm from the midline of the cervical spine, 3 cm inferior to the lower border of the occipital protuberance. The second injection site was 1.5 cm superiorly and laterally to the first injection site.  -Trapezius muscle injection was performed at 3 sites, bilaterally. The first injection site was in the upper trapezius muscle halfway between the inflection point of the neck, and the acromion. The second injection site was one half way between the acromion and the first injection site. The third injection was done between the first injection site and the inflection point of the neck.   Will return for repeat injection in 3 months.   A total of 200 units was used, 155 units of Botox was injected, any Botox not injected was wasted. The patient tolerated the procedure well, there were no complications of the above procedure.

## 2019-12-11 ENCOUNTER — Encounter: Payer: Self-pay | Admitting: Family Medicine

## 2019-12-11 ENCOUNTER — Telehealth: Payer: Self-pay | Admitting: Family Medicine

## 2019-12-11 ENCOUNTER — Ambulatory Visit: Payer: BC Managed Care – PPO | Admitting: Family Medicine

## 2019-12-11 ENCOUNTER — Other Ambulatory Visit: Payer: Self-pay

## 2019-12-11 VITALS — Temp 96.9°F

## 2019-12-11 DIAGNOSIS — G43711 Chronic migraine without aura, intractable, with status migrainosus: Secondary | ICD-10-CM | POA: Diagnosis not present

## 2019-12-11 NOTE — Progress Notes (Signed)
Botox- 100 units x 4 vials Lot: [id number] Expiration: 08/2022 NDC: 7673-4193-79  Bacteriostatic 0.9% Sodium Chloride- 33mL total Lot: [id number] Expiration: 02/22/2020 NDC: 3532-9924-26  Dx: S34.196 S/P

## 2019-12-11 NOTE — Telephone Encounter (Signed)
12 week btx

## 2019-12-12 ENCOUNTER — Other Ambulatory Visit: Payer: Self-pay

## 2019-12-12 ENCOUNTER — Ambulatory Visit (INDEPENDENT_AMBULATORY_CARE_PROVIDER_SITE_OTHER): Payer: BC Managed Care – PPO

## 2019-12-12 DIAGNOSIS — R0789 Other chest pain: Secondary | ICD-10-CM | POA: Diagnosis not present

## 2019-12-12 DIAGNOSIS — I77810 Thoracic aortic ectasia: Secondary | ICD-10-CM | POA: Diagnosis not present

## 2019-12-12 DIAGNOSIS — Z8249 Family history of ischemic heart disease and other diseases of the circulatory system: Secondary | ICD-10-CM

## 2019-12-14 ENCOUNTER — Ambulatory Visit: Payer: BC Managed Care – PPO | Attending: Neurology

## 2019-12-14 ENCOUNTER — Other Ambulatory Visit: Payer: Self-pay

## 2019-12-14 DIAGNOSIS — R42 Dizziness and giddiness: Secondary | ICD-10-CM

## 2019-12-14 DIAGNOSIS — R2689 Other abnormalities of gait and mobility: Secondary | ICD-10-CM | POA: Diagnosis not present

## 2019-12-14 DIAGNOSIS — R2681 Unsteadiness on feet: Secondary | ICD-10-CM | POA: Diagnosis not present
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
## 2019-12-14 NOTE — Patient Instructions (Addendum)
   Walking Head Turn    Standing close to a wall, walk __10__ feet while turning head to the right for 2 steps and left for two steps. Hold counter, if necessary to keep balance. Repeat __4__ times. Do __1__ sessions per day.  http://gt2.exer.us/536   Copyright  VHI. All rights reserved.   Gaze Stabilization: Sitting    Keeping eyes on target on wall 3-5 feet away, tilt head down 15-30 and move head side to side for 20 seconds. Repeat while moving head up and down for _20___ seconds. Do _3-5___ sessions per day.  Gaze Stabilization: Tip Card  1.Target must remain in focus, not blurry, and appear stationary while head is in motion. 2.Perform exercises with small head movements (45 to either side of midline). 3.Increase speed of head motion so long as target is in focus. 4.If you wear eyeglasses, be sure you can see target through lens (therapist will give specific instructions for bifocal / progressive lenses). 5.These exercises may provoke dizziness or nausea. Work through these symptoms. If too dizzy, slow head movement slightly. Rest between each exercise. 6.Exercises demand concentration; avoid distractions. 7.For safety, perform standing exercises close to a counter, wall, corner, or next to someone.  Copyright  VHI. All rights reserved.

## 2019-12-18 ENCOUNTER — Ambulatory Visit: Payer: Self-pay | Admitting: General Surgery

## 2019-12-18 DIAGNOSIS — K8010 Calculus of gallbladder with chronic cholecystitis without obstruction: Secondary | ICD-10-CM | POA: Diagnosis not present

## 2019-12-18 DIAGNOSIS — Z1159 Encounter for screening for other viral diseases: Secondary | ICD-10-CM | POA: Diagnosis not present

## 2019-12-18 DIAGNOSIS — Z20822 Contact with and (suspected) exposure to covid-19: Secondary | ICD-10-CM | POA: Diagnosis not present

## 2019-12-18 DIAGNOSIS — K2100 Gastro-esophageal reflux disease with esophagitis, without bleeding: Secondary | ICD-10-CM | POA: Diagnosis not present

## 2019-12-19 ENCOUNTER — Ambulatory Visit: Payer: Self-pay | Admitting: General Surgery

## 2019-12-19 ENCOUNTER — Ambulatory Visit: Payer: BC Managed Care – PPO | Admitting: Physical Therapy

## 2019-12-20 ENCOUNTER — Telehealth: Payer: Self-pay

## 2019-12-20 NOTE — Telephone Encounter (Signed)
Patient called for echo results. Results given she verbalized understanding. She will discuss results and statin therapy at her f/u appointment.

## 2019-12-21 DIAGNOSIS — K449 Diaphragmatic hernia without obstruction or gangrene: Secondary | ICD-10-CM | POA: Diagnosis not present

## 2019-12-21 DIAGNOSIS — K2100 Gastro-esophageal reflux disease with esophagitis, without bleeding: Secondary | ICD-10-CM | POA: Diagnosis not present

## 2019-12-21 DIAGNOSIS — G4730 Sleep apnea, unspecified: Secondary | ICD-10-CM | POA: Diagnosis not present

## 2019-12-21 DIAGNOSIS — Z905 Acquired absence of kidney: Secondary | ICD-10-CM | POA: Diagnosis not present

## 2019-12-21 DIAGNOSIS — E669 Obesity, unspecified: Secondary | ICD-10-CM | POA: Diagnosis not present

## 2019-12-21 DIAGNOSIS — K8010 Calculus of gallbladder with chronic cholecystitis without obstruction: Secondary | ICD-10-CM | POA: Diagnosis not present

## 2019-12-21 DIAGNOSIS — Z79899 Other long term (current) drug therapy: Secondary | ICD-10-CM | POA: Diagnosis not present

## 2019-12-21 DIAGNOSIS — Z6832 Body mass index (BMI) 32.0-32.9, adult: Secondary | ICD-10-CM | POA: Diagnosis not present

## 2019-12-21 HISTORY — PX: HIATAL HERNIA REPAIR: SHX195

## 2019-12-21 HISTORY — PX: CHOLECYSTECTOMY: SHX55

## 2019-12-22 DIAGNOSIS — G4730 Sleep apnea, unspecified: Secondary | ICD-10-CM | POA: Diagnosis not present

## 2019-12-22 DIAGNOSIS — K449 Diaphragmatic hernia without obstruction or gangrene: Secondary | ICD-10-CM | POA: Diagnosis not present

## 2019-12-22 DIAGNOSIS — K2100 Gastro-esophageal reflux disease with esophagitis, without bleeding: Secondary | ICD-10-CM | POA: Diagnosis not present

## 2019-12-22 DIAGNOSIS — Z6832 Body mass index (BMI) 32.0-32.9, adult: Secondary | ICD-10-CM | POA: Diagnosis not present

## 2019-12-22 DIAGNOSIS — Z905 Acquired absence of kidney: Secondary | ICD-10-CM | POA: Diagnosis not present

## 2019-12-22 DIAGNOSIS — E669 Obesity, unspecified: Secondary | ICD-10-CM | POA: Diagnosis not present

## 2019-12-22 DIAGNOSIS — K8010 Calculus of gallbladder with chronic cholecystitis without obstruction: Secondary | ICD-10-CM | POA: Diagnosis not present

## 2019-12-22 DIAGNOSIS — Z79899 Other long term (current) drug therapy: Secondary | ICD-10-CM | POA: Diagnosis not present

## 2019-12-25 ENCOUNTER — Other Ambulatory Visit (HOSPITAL_COMMUNITY): Payer: BC Managed Care – PPO

## 2019-12-28 ENCOUNTER — Ambulatory Visit: Admit: 2019-12-28 | Payer: BC Managed Care – PPO | Admitting: General Surgery

## 2019-12-28 SURGERY — REPAIR, HERNIA, HIATAL, LAPAROSCOPIC
Anesthesia: General

## 2020-01-19 DIAGNOSIS — M542 Cervicalgia: Secondary | ICD-10-CM | POA: Diagnosis not present

## 2020-01-19 DIAGNOSIS — R0789 Other chest pain: Secondary | ICD-10-CM | POA: Diagnosis not present

## 2020-01-19 DIAGNOSIS — G4489 Other headache syndrome: Secondary | ICD-10-CM | POA: Diagnosis not present

## 2020-01-19 DIAGNOSIS — K8020 Calculus of gallbladder without cholecystitis without obstruction: Secondary | ICD-10-CM | POA: Diagnosis not present

## 2020-01-22 ENCOUNTER — Other Ambulatory Visit: Payer: Self-pay

## 2020-01-22 ENCOUNTER — Encounter: Payer: Self-pay | Admitting: Cardiology

## 2020-01-22 ENCOUNTER — Ambulatory Visit: Payer: BC Managed Care – PPO | Admitting: Cardiology

## 2020-01-22 VITALS — BP 106/74 | HR 88 | Temp 97.6°F | Ht 61.0 in | Wt 164.4 lb

## 2020-01-22 DIAGNOSIS — I700 Atherosclerosis of aorta: Secondary | ICD-10-CM | POA: Diagnosis not present

## 2020-01-22 DIAGNOSIS — I208 Other forms of angina pectoris: Secondary | ICD-10-CM

## 2020-01-22 DIAGNOSIS — I7121 Aneurysm of the ascending aorta, without rupture: Secondary | ICD-10-CM

## 2020-01-22 DIAGNOSIS — E7800 Pure hypercholesterolemia, unspecified: Secondary | ICD-10-CM

## 2020-01-22 DIAGNOSIS — I209 Angina pectoris, unspecified: Secondary | ICD-10-CM | POA: Diagnosis not present

## 2020-01-22 DIAGNOSIS — Z8249 Family history of ischemic heart disease and other diseases of the circulatory system: Secondary | ICD-10-CM

## 2020-01-22 DIAGNOSIS — I712 Thoracic aortic aneurysm, without rupture: Secondary | ICD-10-CM

## 2020-01-22 MED ORDER — METOPROLOL SUCCINATE ER 25 MG PO TB24: 25.0000 mg | ORAL_TABLET | Freq: Every day | ORAL | 2 refills | Status: DC

## 2020-01-22 MED ORDER — ASPIRIN EC 81 MG PO TBEC: 81.0000 mg | DELAYED_RELEASE_TABLET | Freq: Every day | ORAL | 3 refills | Status: DC
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
## 2020-01-22 NOTE — Patient Instructions (Signed)
You have been prescribed metoprolol succinate 25 mg.  Take 1 tablet once a day for your heart condition.  You have been scheduled for cardiac CT angiogram.  The night before please take 1 extra dose of the metoprolol succinate, on the day of take 2 tablets of metoprolol succinate.

## 2020-01-24 ENCOUNTER — Ambulatory Visit: Payer: BC Managed Care – PPO | Admitting: Neurology

## 2020-01-24 ENCOUNTER — Encounter: Payer: Self-pay | Admitting: Neurology

## 2020-01-24 ENCOUNTER — Other Ambulatory Visit: Payer: Self-pay

## 2020-01-24 VITALS — BP 126/90 | HR 80 | Temp 97.9°F | Ht 61.0 in | Wt 162.0 lb

## 2020-01-24 DIAGNOSIS — G43711 Chronic migraine without aura, intractable, with status migrainosus: Secondary | ICD-10-CM

## 2020-01-24 MED ORDER — AJOVY 225 MG/1.5ML ~~LOC~~ SOAJ: 225.0000 mg | SUBCUTANEOUS | 11 refills | Status: DC

## 2020-01-24 NOTE — Patient Instructions (Addendum)
We will call to schedule next botox Try Ajovy  Fremanezumab injection What is this medicine? FREMANEZUMAB (fre ma NEZ ue mab) is used to prevent migraine headaches. This medicine may be used for other purposes; ask your health care provider or pharmacist if you have questions. COMMON BRAND NAME(S): AJOVY What should I tell my health care provider before I take this medicine? They need to know if you have any of these conditions:  an unusual or allergic reaction to fremanezumab, other medicines, foods, dyes, or preservatives  pregnant or trying to get pregnant  breast-feeding How should I use this medicine? This medicine is for injection under the skin. You will be taught how to prepare and give this medicine. Use exactly as directed. Take your medicine at regular intervals. Do not take your medicine more often than directed. It is important that you put your used needles and syringes in a special sharps container. Do not put them in a trash can. If you do not have a sharps container, call your pharmacist or healthcare provider to get one. Talk to your pediatrician regarding the use of this medicine in children. Special care may be needed. Overdosage: If you think you have taken too much of this medicine contact a poison control center or emergency room at once. NOTE: This medicine is only for you. Do not share this medicine with others. What if I miss a dose? If you miss a dose, take it as soon as you can. If it is almost time for your next dose, take only that dose. Do not take double or extra doses. What may interact with this medicine? Interactions are not expected. This list may not describe all possible interactions. Give your health care provider a list of all the medicines, herbs, non-prescription drugs, or dietary supplements you use. Also tell them if you smoke, drink alcohol, or use illegal drugs. Some items may interact with your medicine. What should I watch for while using  this medicine? Tell your doctor or healthcare professional if your symptoms do not start to get better or if they get worse. What side effects may I notice from receiving this medicine? Side effects that you should report to your doctor or health care professional as soon as possible:  allergic reactions like skin rash, itching or hives, swelling of the face, lips, or tongue Side effects that usually do not require medical attention (report these to your doctor or health care professional if they continue or are bothersome):  pain, redness, or irritation at site where injected This list may not describe all possible side effects. Call your doctor for medical advice about side effects. You may report side effects to FDA at 1-800-FDA-1088. Where should I keep my medicine? Keep out of the reach of children. You will be instructed on how to store this medicine. Throw away any unused medicine after the expiration date on the label. NOTE: This sheet is a summary. It may not cover all possible information. If you have questions about this medicine, talk to your doctor, pharmacist, or health care provider.  2020 Elsevier/Gold Standard (2017-08-09 17:22:56)
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
## 2020-01-25 ENCOUNTER — Encounter: Payer: Self-pay | Admitting: *Deleted

## 2020-01-25 ENCOUNTER — Telehealth: Payer: Self-pay | Admitting: Neurology

## 2020-01-25 NOTE — Telephone Encounter (Addendum)
I spoke with the patient and discussed. She prefers to download card on her phone. I will message pt the info in mychart. Pt aware the pharmacy can use the card right away and if they have trouble they need to call the pharmacy help desk.   Mychart message sent.

## 2020-01-25 NOTE — Telephone Encounter (Signed)
Ajovy PA done. Key: [id number]. Approved immediately. CaseId:[id number];Status:Approved;Review Type:Prior Auth;Coverage Start Date:12/26/2019;Coverage End Date:01/24/2021;

## 2020-01-25 NOTE — Telephone Encounter (Signed)
Pt has called to report that her Ajovy savings card issues yesterday expired 03-2019, please call if pt can come by the office for another.

## 2020-01-26 ENCOUNTER — Encounter: Payer: Self-pay | Admitting: Neurology

## 2020-01-29 ENCOUNTER — Telehealth: Payer: Self-pay

## 2020-01-29 NOTE — Telephone Encounter (Signed)
If she cannot wait, then we have to proceed with Left Heart Catheterization   JG

## 2020-01-29 NOTE — Telephone Encounter (Signed)
The patient called and said that she is still in pain. Chest pain is still the same and metoprolol  makes her sleepy and she feels it is not helping. Marland Kitchen

## 2020-01-30 DIAGNOSIS — E782 Mixed hyperlipidemia: Secondary | ICD-10-CM | POA: Diagnosis not present

## 2020-02-05 DIAGNOSIS — M5412 Radiculopathy, cervical region: Secondary | ICD-10-CM | POA: Diagnosis not present

## 2020-02-05 DIAGNOSIS — Z981 Arthrodesis status: Secondary | ICD-10-CM | POA: Diagnosis not present

## 2020-02-05 DIAGNOSIS — M542 Cervicalgia: Secondary | ICD-10-CM | POA: Diagnosis not present

## 2020-02-07 DIAGNOSIS — G8928 Other chronic postprocedural pain: Secondary | ICD-10-CM | POA: Insufficient documentation

## 2020-02-21 ENCOUNTER — Telehealth: Payer: Self-pay

## 2020-02-21 DIAGNOSIS — Z905 Acquired absence of kidney: Secondary | ICD-10-CM | POA: Diagnosis not present

## 2020-02-21 DIAGNOSIS — Z23 Encounter for immunization: Secondary | ICD-10-CM | POA: Diagnosis not present

## 2020-02-21 NOTE — Telephone Encounter (Signed)
Can  you find out what she is talking about. I am ordering the CT and

## 2020-02-21 NOTE — Telephone Encounter (Signed)
Please review

## 2020-02-22 NOTE — Telephone Encounter (Signed)
Can you call and see if what is the issue for her to have CT. Her BMP is normal

## 2020-02-26 ENCOUNTER — Other Ambulatory Visit (HOSPITAL_COMMUNITY): Payer: Self-pay | Admitting: Emergency Medicine

## 2020-02-26 ENCOUNTER — Telehealth (HOSPITAL_COMMUNITY): Payer: Self-pay | Admitting: Emergency Medicine

## 2020-02-26 ENCOUNTER — Encounter (HOSPITAL_COMMUNITY): Payer: Self-pay

## 2020-02-26 DIAGNOSIS — Z905 Acquired absence of kidney: Secondary | ICD-10-CM

## 2020-02-26 MED ORDER — PREDNISONE 50 MG PO TABS: ORAL_TABLET | ORAL | 0 refills | Status: DC

## 2020-02-26 NOTE — Telephone Encounter (Signed)
Attempted to call patient regarding upcoming cardiac CT appointment. °Left message on voicemail with name and callback number °Rickie Gange RN Navigator Cardiac Imaging °[facility] °[phone] Office °[phone] Cell ° °

## 2020-02-26 NOTE — Telephone Encounter (Signed)
Pt returning phone call regarding upcoming cardiac imaging study; pt verbalizes understanding of appt date/time, parking situation and where to check in, pre-test NPO status and medications ordered, and verified current allergies; name and call back number provided for further questions should they arise Rockwell Alexandria RN Navigator Cardiac Imaging [facility] [phone] office [phone] cell   Pt states she donated a kidney in 2014. Although shes had no issues with remaining kidney function, she wants to do all she can to protect remaining kidney. Therefore appt made for IV hydration protocol for medical day at 10am. Pt verbalized understanding of check in process and length of appt.  Pt also states her 1st and only experience with IV contrast she got N/V and syncopal. I went ahead and prescribed PO contrast allergy prep medications for her and explained to her when to take them. Pt verbalized understanding.   All instructions also sent to patient in mychart message which she appreciated.  Rockwell Alexandria RN Navigator Cardiac Imaging [facility] [phone] Office  [phone] Cell

## 2020-02-27 ENCOUNTER — Ambulatory Visit (HOSPITAL_COMMUNITY)
Admission: RE | Admit: 2020-02-27 | Discharge: 2020-02-27 | Disposition: A | Discharge status: Home / Self Care | Payer: BC Managed Care – PPO | Source: Ambulatory Visit | Attending: Cardiology | Admitting: Cardiology

## 2020-02-27 ENCOUNTER — Other Ambulatory Visit: Payer: Self-pay

## 2020-02-27 DIAGNOSIS — I208 Other forms of angina pectoris: Secondary | ICD-10-CM | POA: Insufficient documentation

## 2020-02-27 DIAGNOSIS — I209 Angina pectoris, unspecified: Secondary | ICD-10-CM

## 2020-02-27 LAB — BASIC METABOLIC PANEL
Anion gap: 9 (ref 5–15)
BUN: 16 mg/dL (ref 6–20)
CO2: 24 mmol/L (ref 22–32)
Calcium: 9.8 mg/dL (ref 8.9–10.3)
Chloride: 105 mmol/L (ref 98–111)
Creatinine, Ser: 0.95 mg/dL (ref 0.44–1.00)
GFR calc Af Amer: >60 mL/min (ref >60)
GFR calc non Af Amer: >60 mL/min (ref >60)
Glucose, Bld: 143 mg/dL — ABNORMAL HIGH (ref 70–99)
Potassium: 4.7 mmol/L (ref 3.5–5.1)
Sodium: 138 mmol/L (ref 135–145)

## 2020-02-27 MED ORDER — NITROGLYCERIN 0.4 MG SL SUBL
SUBLINGUAL_TABLET | SUBLINGUAL | Status: AC
  Filled 2020-02-27: qty 2

## 2020-02-27 MED ORDER — IOHEXOL 350 MG/ML SOLN
80.0000 mL | Freq: Once | INTRAVENOUS | Status: AC | PRN
  Administered 2020-02-27: 80 mL via INTRAVENOUS

## 2020-02-27 MED ORDER — METOPROLOL TARTRATE 5 MG/5ML IV SOLN: 5.0000 mg | INTRAVENOUS | Status: DC | PRN

## 2020-02-27 MED ORDER — SODIUM CHLORIDE 0.9 % WEIGHT BASED INFUSION: 1.0000 mL/kg/h | INTRAVENOUS | Status: DC

## 2020-02-27 MED ORDER — NITROGLYCERIN 0.4 MG SL SUBL
0.8000 mg | SUBLINGUAL_TABLET | Freq: Once | SUBLINGUAL | Status: AC
  Administered 2020-02-27: 0.8 mg via SUBLINGUAL

## 2020-02-27 MED ORDER — SODIUM CHLORIDE 0.9 % WEIGHT BASED INFUSION
3.0000 mL/kg/h | INTRAVENOUS | Status: DC
  Administered 2020-02-27: 3 mL/kg/h via INTRAVENOUS

## 2020-02-27 NOTE — Progress Notes (Signed)
CT notified that pt is here, labs drawn and fluids started at 1010. Will be ready for CT @ 1110

## 2020-03-08 ENCOUNTER — Ambulatory Visit: Payer: BC Managed Care – PPO | Admitting: Cardiology

## 2020-03-09 ENCOUNTER — Ambulatory Visit: Payer: BC Managed Care – PPO

## 2020-03-11 ENCOUNTER — Encounter: Payer: Self-pay | Admitting: *Deleted

## 2020-03-11 ENCOUNTER — Telehealth: Payer: Self-pay | Admitting: Neurology

## 2020-03-11 ENCOUNTER — Other Ambulatory Visit: Payer: Self-pay | Admitting: Neurology

## 2020-03-11 DIAGNOSIS — G43711 Chronic migraine without aura, intractable, with status migrainosus: Secondary | ICD-10-CM

## 2020-03-11 DIAGNOSIS — S069X9A Unspecified intracranial injury with loss of consciousness of unspecified duration, initial encounter: Secondary | ICD-10-CM

## 2020-03-11 NOTE — Telephone Encounter (Signed)
Usually I tell patient to ask her pcp to place this, [facility] seems to be more responsive from pcps but I did place the referral anyway thanks

## 2020-03-11 NOTE — Telephone Encounter (Signed)
Patient called to check on the status of her referral to [facility] from last OV states she has not heard back for scheduling

## 2020-03-11 NOTE — Telephone Encounter (Signed)
Sent pt a mychart message. 

## 2020-03-13 ENCOUNTER — Ambulatory Visit: Payer: BC Managed Care – PPO | Admitting: Cardiology

## 2020-03-13 ENCOUNTER — Encounter: Payer: Self-pay | Admitting: Cardiology

## 2020-03-13 ENCOUNTER — Other Ambulatory Visit: Payer: Self-pay

## 2020-03-13 VITALS — BP 123/81 | HR 96 | Ht 61.0 in | Wt 165.0 lb

## 2020-03-13 DIAGNOSIS — Z8249 Family history of ischemic heart disease and other diseases of the circulatory system: Secondary | ICD-10-CM

## 2020-03-13 DIAGNOSIS — I201 Angina pectoris with documented spasm: Secondary | ICD-10-CM | POA: Diagnosis not present

## 2020-03-13 DIAGNOSIS — I209 Angina pectoris, unspecified: Secondary | ICD-10-CM

## 2020-03-13 DIAGNOSIS — E7800 Pure hypercholesterolemia, unspecified: Secondary | ICD-10-CM

## 2020-03-13 DIAGNOSIS — I77819 Aortic ectasia, unspecified site: Secondary | ICD-10-CM

## 2020-03-13 MED ORDER — AMLODIPINE BESYLATE 5 MG PO TABS: 5.0000 mg | ORAL_TABLET | Freq: Every evening | ORAL | 2 refills | Status: DC
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
## 2020-03-14 DIAGNOSIS — M961 Postlaminectomy syndrome, not elsewhere classified: Secondary | ICD-10-CM | POA: Insufficient documentation

## 2020-03-20 DIAGNOSIS — K219 Gastro-esophageal reflux disease without esophagitis: Secondary | ICD-10-CM | POA: Diagnosis not present

## 2020-03-20 DIAGNOSIS — Z1239 Encounter for other screening for malignant neoplasm of breast: Secondary | ICD-10-CM | POA: Diagnosis not present

## 2020-03-20 DIAGNOSIS — E782 Mixed hyperlipidemia: Secondary | ICD-10-CM | POA: Diagnosis not present

## 2020-03-20 DIAGNOSIS — Z905 Acquired absence of kidney: Secondary | ICD-10-CM | POA: Diagnosis not present

## 2020-03-21 DIAGNOSIS — Z905 Acquired absence of kidney: Secondary | ICD-10-CM | POA: Diagnosis not present

## 2020-03-25 ENCOUNTER — Telehealth: Payer: Self-pay | Admitting: Neurology

## 2020-03-25 NOTE — Telephone Encounter (Signed)
Pt called to schedule her next botox apt

## 2020-03-26 NOTE — Telephone Encounter (Signed)
Pt called back to schedule her botox apt

## 2020-03-27 NOTE — Telephone Encounter (Signed)
I returned patients call to schedule her Botox. Patient states she was due in April and want something as soon as possible.  Unfortunately the first available in in late June.  Patient states this will not work because she will start getting headaches and it is not her fault she didn't get her appointment like she should have.  Wants to know if there is any way she could be worked in?  She does get her medication from a specialty pharmacy so the appt would need to be at lease a week out in order for her medication to get her in time.  Please advise.

## 2020-03-28 ENCOUNTER — Telehealth: Payer: Self-pay | Admitting: *Deleted

## 2020-03-28 ENCOUNTER — Other Ambulatory Visit: Payer: Self-pay | Admitting: Internal Medicine

## 2020-03-28 DIAGNOSIS — Z1231 Encounter for screening mammogram for malignant neoplasm of breast: Secondary | ICD-10-CM

## 2020-03-28 MED ORDER — BOTOX 100 UNITS IJ SOLR: INTRAMUSCULAR | 0 refills | Status: DC

## 2020-03-28 NOTE — Telephone Encounter (Signed)
Will you please send a prescription to [facility]? I called [employer] [phone] and spoke to Rachael.  She states that [id number] and 55974 are valid.  B6384 will require PA through [facility] [phone]. [facility] will be the [facility] since [facility] longer [facility].  Ref# for call is [id number]. I called [facility] and spoke to [facility] and she states that she does not see a PA on file. We initiated one over the phone. Pending PA# [id number]. She states a form will have to be filled out and faxed back. Form filled out and faxed back.

## 2020-03-28 NOTE — Telephone Encounter (Addendum)
Sylvia [name] has an opening on May 18th @ 3 pm we can offer her that.

## 2020-03-28 NOTE — Telephone Encounter (Signed)
Rx sent 

## 2020-03-28 NOTE — Telephone Encounter (Signed)
Does this need to be 100 unit vial or 200 unit vial?

## 2020-03-28 NOTE — Telephone Encounter (Signed)
I called patient and scheduled her for May 18 at [facility] with Amy Lomax

## 2020-03-28 NOTE — Addendum Note (Signed)
Addended by: Bertram Savin on: 03/28/2020 01:10 PM   Modules accepted: Orders

## 2020-03-28 NOTE — Telephone Encounter (Signed)
Looks like the original one was for 100 Unit so we should probably keep it at 100U

## 2020-04-01 ENCOUNTER — Encounter: Payer: Self-pay | Admitting: *Deleted

## 2020-04-01 NOTE — Telephone Encounter (Signed)
PA received from [facility].  PA# [id number].  The valid dates are not of the letter.  I called to get the valid dates of the approval but they are not open yet.  I will call later.   PA form filled out and faxed to [facility] [phone].

## 2020-04-01 NOTE — Telephone Encounter (Signed)
I called [facility] back and spoke to [facility].  She confirmed the PA# as [id number] and it is valid from 03/28/2020 through 03/27/2021.  Ref# for call is [id number] PM.

## 2020-04-02 NOTE — Telephone Encounter (Signed)
Patient is scheduled 04/09/2020 for Botox.  I called [facility] [phone] and spoke to Louis to check the status of the PA, status of prescription and see if we can schedule delivery.  They have patient consent but still waiting on PA.

## 2020-04-03 NOTE — Telephone Encounter (Signed)
I received PA for Botox via fax.  PA Case ID [id number] Valid from 03/03/2020-04/02/2021.  I called [facility] [phone] and spoke to [place].  Delivery was scheduled for 04/04/2020.

## 2020-04-05 DIAGNOSIS — M5033 Other cervical disc degeneration, cervicothoracic region: Secondary | ICD-10-CM | POA: Diagnosis not present

## 2020-04-08 ENCOUNTER — Other Ambulatory Visit: Payer: Self-pay

## 2020-04-08 ENCOUNTER — Ambulatory Visit
Admission: RE | Admit: 2020-04-08 | Discharge: 2020-04-08 | Disposition: A | Discharge status: Home / Self Care | Payer: BC Managed Care – PPO | Source: Ambulatory Visit | Attending: Internal Medicine | Admitting: Internal Medicine

## 2020-04-08 DIAGNOSIS — Z1231 Encounter for screening mammogram for malignant neoplasm of breast: Secondary | ICD-10-CM | POA: Diagnosis not present

## 2020-04-09 ENCOUNTER — Ambulatory Visit: Payer: BC Managed Care – PPO | Admitting: Family Medicine

## 2020-04-09 ENCOUNTER — Other Ambulatory Visit: Payer: Self-pay

## 2020-04-09 DIAGNOSIS — G43711 Chronic migraine without aura, intractable, with status migrainosus: Secondary | ICD-10-CM | POA: Diagnosis not present

## 2020-04-09 NOTE — Progress Notes (Signed)
This is her second Botox treatment with [facility]. Last procedure 12/11/2019. Patient reports not being called to schedule follow up. She called the office on 5/3 to schedule today's visit. She reports that migraines are significantly improved with Botox. Migraines return just prior to next dose. She has been referred to [facility] for second opinion. Patient educated on Botox protocol for headache prevention.    Consent Form Botulism Toxin Injection For Chronic Migraine    Reviewed orally with patient, additionally signature is on file:  Botulism toxin has been approved by the Federal drug administration for treatment of chronic migraine. Botulism toxin does not cure chronic migraine and it may not be effective in some patients.  The administration of botulism toxin is accomplished by injecting a small amount of toxin into the muscles of the neck and head. Dosage must be titrated for each individual. Any benefits resulting from botulism toxin tend to wear off after 3 months with a repeat injection required if benefit is to be maintained. Injections are usually done every 3-4 months with maximum effect peak achieved by about 2 or 3 weeks. Botulism toxin is expensive and you should be sure of what costs you will incur resulting from the injection.  The side effects of botulism toxin use for chronic migraine may include:   -Transient, and usually mild, facial weakness with facial injections  -Transient, and usually mild, head or neck weakness with head/neck injections  -Reduction or loss of forehead facial animation due to forehead muscle weakness  -Eyelid drooping  -Dry eye  -Pain at the site of injection or bruising at the site of injection  -Double vision  -Potential unknown long term risks   Contraindications: You should not have Botox if you are pregnant, nursing, allergic to albumin, have an infection, skin condition, or muscle weakness at the site of the injection, or have myasthenia gravis,  Lambert-Eaton syndrome, or ALS.  It is also possible that as with any injection, there may be an allergic reaction or no effect from the medication. Reduced effectiveness after repeated injections is sometimes seen and rarely infection at the injection site may occur. All care will be taken to prevent these side effects. If therapy is given over a long time, atrophy and wasting in the muscle injected may occur. Occasionally the patient's become refractory to treatment because they develop antibodies to the toxin. In this event, therapy needs to be modified.  I have read the above information and consent to the administration of botulism toxin.    BOTOX PROCEDURE NOTE FOR MIGRAINE HEADACHE  Contraindications and precautions discussed with patient(above). Aseptic procedure was observed and patient tolerated procedure. Procedure performed by Shawnie Dapper, FNP-C.   The condition has existed for more than 6 months, and pt does not have a diagnosis of ALS, Myasthenia Gravis or Lambert-Eaton Syndrome.  Risks and benefits of injections discussed and pt agrees to proceed with the procedure.  Written consent obtained  These injections are medically necessary. Pt  receives good benefits from these injections. These injections do not cause sedations or hallucinations which the oral therapies may cause.   Description of procedure:  The patient was placed in a sitting position. The standard protocol was used for Botox as follows, with 5 units of Botox injected at each site:  -Procerus muscle, midline injection  -Corrugator muscle, bilateral injection  -Frontalis muscle, bilateral injection, with 2 sites each side, medial injection was performed in the upper one third of the frontalis muscle, in the  region vertical from the medial inferior edge of the superior orbital rim. The lateral injection was again in the upper one third of the forehead vertically above the lateral limbus of the cornea, 1.5 cm lateral  to the medial injection site.  -Temporalis muscle injection, 4 sites, bilaterally. The first injection was 3 cm above the tragus of the ear, second injection site was 1.5 cm to 3 cm up from the first injection site in line with the tragus of the ear. The third injection site was 1.5-3 cm forward between the first 2 injection sites. The fourth injection site was 1.5 cm posterior to the second injection site. 5th site laterally in the temporalis  muscleat the level of the outer canthus.  -Occipitalis muscle injection, 3 sites, bilaterally. The first injection was done one half way between the occipital protuberance and the tip of the mastoid process behind the ear. The second injection site was done lateral and superior to the first, 1 fingerbreadth from the first injection. The third injection site was 1 fingerbreadth superiorly and medially from the first injection site.  -Cervical paraspinal muscle injection, 2 sites, bilaterally. The first injection site was 1 cm from the midline of the cervical spine, 3 cm inferior to the lower border of the occipital protuberance. The second injection site was 1.5 cm superiorly and laterally to the first injection site.  -Trapezius muscle injection was performed at 3 sites, bilaterally. The first injection site was in the upper trapezius muscle halfway between the inflection point of the neck, and the acromion. The second injection site was one half way between the acromion and the first injection site. The third injection was done between the first injection site and the inflection point of the neck.   Will return for repeat injection in 3 months.   A total of 200 units of Botox was prepared, 155 units of Botox was injected as documented above, any Botox not injected was wasted. The patient tolerated the procedure well, there were no complications of the above procedure.

## 2020-04-09 NOTE — Telephone Encounter (Signed)
Spoke with patient this afternoon about her Botox appointment today. She states that she prefers MD and should not have had an appointment with NP. I advised patient that this was most likely because the next Botox appt with Dr. Lucia Gaskins was in June/July at the time of scheduling and patient demanded botox asap. All that was available was with Amy. Next scheduled Botox is with Amy also. New PA is required if patient requests another provider. I changed next appointment to MD and if okay with Dr. Lucia Gaskins I will proceed with new prior authorization. Please advise.

## 2020-04-09 NOTE — Progress Notes (Signed)
Botox- 200 units x 2 vial Lot: [id number] Expiration: 08/2022 NDC: 2694-8546-27   Bacteriostatic 0.9% Sodium Chloride- 63mL total Lot-[id number] Expiration:02/22  NDC: 03500-938-18   S/P

## 2020-04-09 NOTE — Telephone Encounter (Signed)
Mrs Salsberry was seen for her second Botox procedure today. When entering the room, she was upset stating that she "was off schedule because you didn't call me back for an appointment". I reviewed her chart and noted multiple Mychart conversations and telephone notes, however, she did not inquire about follow up appointment until 03/25/2020. She was scheduled with me as Dr Lucia Gaskins did not have availability until July. As discussed at her first Botox procedure in 11/2019, I performed Botox per headache prevention protocol and as documented. Following procedure she asked for me to administer the remaining Botox units in various places including temples and eyes. She became frustrated with me stating that she didn't understand why I would not give her the extra units of Botox. She reports previous neurologist in [place] would "not waste Botox". I attempted to explain that I was not comfortable with administering Botox off protocol. She asked that I go get Dr Lucia Gaskins to administer it for her. I had already discussed this case with Dr Lucia Gaskins as I had a similar experience with her at her first visit. I explained that Dr Lucia Gaskins was unavailable and that I would not administer extra units as requested. She then requested to only see Dr Lucia Gaskins. I explained that Dr Trevor Mace schedule would most likely not accommodate regular Botox visits every 12 weeks as she is seeing new and more complicated visits. I told Mrs Degrasse that I would relay her request to Dr Lucia Gaskins. She has been referred to [facility] for second opinion as we have been unsuccessful in managing her headaches. She reports having an appt scheduled in July.

## 2020-04-10 NOTE — Telephone Encounter (Signed)
I think that Patient's care would be better served by another neurologist office, she has been referred to [facility] and I would like her to transfer all there and not schedule her next botox with [place]. Patient-doctor relationship is quite strained and her care would be better with another physician's office. She has been very hateful to the staff as well. Let's discuss thank you.

## 2020-04-10 NOTE — Telephone Encounter (Signed)
Patient is not B/B, uses [facility]. FYI

## 2020-04-16 ENCOUNTER — Telehealth: Payer: Self-pay | Admitting: Family Medicine

## 2020-04-16 NOTE — Telephone Encounter (Signed)
I talked to patient again this am and she relayed to me that she wanted to go to [facility]  for her Botox . I have called [facility] she is registered . [facility] is  aware that she will need her next injection in August  Schedulers will get records to [facility] and Leavy Cella will call patient to schedule. I have talked to patient and she is aware of details .   [facility] apt has been CX.  [facility] Tele[facility] [phone] Fax [phone]

## 2020-04-16 NOTE — Telephone Encounter (Signed)
I agree with plan. Thank you for assisting this patient.

## 2020-04-16 NOTE — Telephone Encounter (Signed)
I talked to patient last Night and relayed that she has apt with [facility] in June and that we would also let [facility] take over her Botox that would be the best option since [facility] would be seeing her . I relayed to patient that I would be CX her apt with [facility] in August and I would make sure her apt with [facility] was handled and her Botox set up. Patient has my direct contact and I will be talking to her back and forth . Thanks Annabelle Harman

## 2020-04-22 DIAGNOSIS — Z20822 Contact with and (suspected) exposure to covid-19: Secondary | ICD-10-CM | POA: Diagnosis not present

## 2020-04-23 NOTE — Telephone Encounter (Signed)
Patient is scheduled with Dr. Milon Dikes July 22 at 1:00 for her Botox consult . Patient is aware and Patient will have her Botox injection before August.

## 2020-05-15 DIAGNOSIS — Z7409 Other reduced mobility: Secondary | ICD-10-CM | POA: Diagnosis not present

## 2020-05-15 DIAGNOSIS — R413 Other amnesia: Secondary | ICD-10-CM | POA: Diagnosis not present

## 2020-05-15 DIAGNOSIS — H9193 Unspecified hearing loss, bilateral: Secondary | ICD-10-CM | POA: Diagnosis not present

## 2020-05-15 DIAGNOSIS — R42 Dizziness and giddiness: Secondary | ICD-10-CM | POA: Diagnosis not present

## 2020-06-03 ENCOUNTER — Other Ambulatory Visit: Payer: Self-pay | Admitting: Cardiology

## 2020-06-03 DIAGNOSIS — I201 Angina pectoris with documented spasm: Secondary | ICD-10-CM

## 2020-06-09 DIAGNOSIS — Z20822 Contact with and (suspected) exposure to covid-19: Secondary | ICD-10-CM | POA: Diagnosis not present

## 2020-06-10 DIAGNOSIS — H903 Sensorineural hearing loss, bilateral: Secondary | ICD-10-CM | POA: Diagnosis not present

## 2020-06-10 DIAGNOSIS — H9201 Otalgia, right ear: Secondary | ICD-10-CM | POA: Diagnosis not present

## 2020-06-11 ENCOUNTER — Other Ambulatory Visit: Payer: Self-pay | Admitting: Neurology

## 2020-06-17 DIAGNOSIS — Z7409 Other reduced mobility: Secondary | ICD-10-CM | POA: Diagnosis not present

## 2020-06-17 DIAGNOSIS — F0781 Postconcussional syndrome: Secondary | ICD-10-CM | POA: Diagnosis not present

## 2020-06-19 DIAGNOSIS — Z7409 Other reduced mobility: Secondary | ICD-10-CM | POA: Diagnosis not present

## 2020-06-19 DIAGNOSIS — F0781 Postconcussional syndrome: Secondary | ICD-10-CM | POA: Diagnosis not present

## 2020-06-20 DIAGNOSIS — E785 Hyperlipidemia, unspecified: Secondary | ICD-10-CM | POA: Diagnosis not present

## 2020-06-24 DIAGNOSIS — M542 Cervicalgia: Secondary | ICD-10-CM | POA: Diagnosis not present

## 2020-06-24 DIAGNOSIS — E782 Mixed hyperlipidemia: Secondary | ICD-10-CM | POA: Diagnosis not present

## 2020-06-24 DIAGNOSIS — R4189 Other symptoms and signs involving cognitive functions and awareness: Secondary | ICD-10-CM | POA: Diagnosis not present

## 2020-06-24 DIAGNOSIS — Z7409 Other reduced mobility: Secondary | ICD-10-CM | POA: Diagnosis not present

## 2020-06-24 DIAGNOSIS — F0781 Postconcussional syndrome: Secondary | ICD-10-CM | POA: Diagnosis not present

## 2020-06-25 DIAGNOSIS — N182 Chronic kidney disease, stage 2 (mild): Secondary | ICD-10-CM | POA: Diagnosis not present

## 2020-07-09 DIAGNOSIS — F0781 Postconcussional syndrome: Secondary | ICD-10-CM | POA: Diagnosis not present

## 2020-07-09 DIAGNOSIS — Z7409 Other reduced mobility: Secondary | ICD-10-CM | POA: Diagnosis not present

## 2020-07-10 DIAGNOSIS — Z20822 Contact with and (suspected) exposure to covid-19: Secondary | ICD-10-CM | POA: Diagnosis not present

## 2020-07-11 ENCOUNTER — Ambulatory Visit: Payer: BC Managed Care – PPO | Admitting: Family Medicine

## 2020-07-11 ENCOUNTER — Telehealth: Payer: Self-pay | Admitting: Family Medicine

## 2020-07-11 NOTE — Telephone Encounter (Signed)
Patient has transferred care and is now seeing Dr Leanna Battles with [facility].

## 2020-07-16 ENCOUNTER — Ambulatory Visit: Payer: BC Managed Care – PPO | Admitting: Neurology

## 2020-08-05 DIAGNOSIS — F4310 Post-traumatic stress disorder, unspecified: Secondary | ICD-10-CM | POA: Diagnosis not present

## 2020-08-05 DIAGNOSIS — X58XXXS Exposure to other specified factors, sequela: Secondary | ICD-10-CM | POA: Diagnosis not present

## 2020-08-05 DIAGNOSIS — G43111 Migraine with aura, intractable, with status migrainosus: Secondary | ICD-10-CM | POA: Diagnosis not present

## 2020-08-05 DIAGNOSIS — F411 Generalized anxiety disorder: Secondary | ICD-10-CM | POA: Diagnosis not present

## 2020-08-05 DIAGNOSIS — I719 Aortic aneurysm of unspecified site, without rupture: Secondary | ICD-10-CM | POA: Diagnosis not present

## 2020-08-05 DIAGNOSIS — S0990XS Unspecified injury of head, sequela: Secondary | ICD-10-CM | POA: Diagnosis not present

## 2020-08-05 DIAGNOSIS — N200 Calculus of kidney: Secondary | ICD-10-CM | POA: Diagnosis not present

## 2020-08-05 DIAGNOSIS — I10 Essential (primary) hypertension: Secondary | ICD-10-CM | POA: Diagnosis not present

## 2020-08-05 DIAGNOSIS — G43909 Migraine, unspecified, not intractable, without status migrainosus: Secondary | ICD-10-CM | POA: Diagnosis not present

## 2020-08-05 DIAGNOSIS — K589 Irritable bowel syndrome without diarrhea: Secondary | ICD-10-CM | POA: Diagnosis not present

## 2020-08-05 DIAGNOSIS — Z905 Acquired absence of kidney: Secondary | ICD-10-CM | POA: Diagnosis not present

## 2020-08-07 DIAGNOSIS — N182 Chronic kidney disease, stage 2 (mild): Secondary | ICD-10-CM | POA: Diagnosis not present

## 2020-08-07 DIAGNOSIS — F0781 Postconcussional syndrome: Secondary | ICD-10-CM | POA: Diagnosis not present

## 2020-08-07 DIAGNOSIS — Z7409 Other reduced mobility: Secondary | ICD-10-CM | POA: Diagnosis not present

## 2020-08-07 DIAGNOSIS — N200 Calculus of kidney: Secondary | ICD-10-CM | POA: Diagnosis not present

## 2020-08-07 DIAGNOSIS — Z905 Acquired absence of kidney: Secondary | ICD-10-CM | POA: Insufficient documentation

## 2020-08-23 DIAGNOSIS — M47812 Spondylosis without myelopathy or radiculopathy, cervical region: Secondary | ICD-10-CM | POA: Diagnosis not present

## 2020-09-19 DIAGNOSIS — Z87820 Personal history of traumatic brain injury: Secondary | ICD-10-CM | POA: Diagnosis not present

## 2020-09-19 DIAGNOSIS — Z8669 Personal history of other diseases of the nervous system and sense organs: Secondary | ICD-10-CM | POA: Diagnosis not present

## 2020-09-19 DIAGNOSIS — Z23 Encounter for immunization: Secondary | ICD-10-CM | POA: Diagnosis not present

## 2020-09-19 DIAGNOSIS — H6121 Impacted cerumen, right ear: Secondary | ICD-10-CM | POA: Diagnosis not present

## 2020-09-25 ENCOUNTER — Other Ambulatory Visit: Payer: Self-pay | Admitting: Internal Medicine

## 2020-09-25 DIAGNOSIS — R5383 Other fatigue: Secondary | ICD-10-CM | POA: Diagnosis not present

## 2020-09-25 DIAGNOSIS — R718 Other abnormality of red blood cells: Secondary | ICD-10-CM | POA: Diagnosis not present

## 2020-09-25 DIAGNOSIS — E041 Nontoxic single thyroid nodule: Secondary | ICD-10-CM

## 2020-09-25 DIAGNOSIS — Z1211 Encounter for screening for malignant neoplasm of colon: Secondary | ICD-10-CM | POA: Diagnosis not present

## 2020-09-25 DIAGNOSIS — Z0001 Encounter for general adult medical examination with abnormal findings: Secondary | ICD-10-CM | POA: Diagnosis not present

## 2020-09-25 DIAGNOSIS — Z01419 Encounter for gynecological examination (general) (routine) without abnormal findings: Secondary | ICD-10-CM | POA: Diagnosis not present

## 2020-09-25 DIAGNOSIS — M542 Cervicalgia: Secondary | ICD-10-CM | POA: Diagnosis not present

## 2020-09-25 DIAGNOSIS — Z1231 Encounter for screening mammogram for malignant neoplasm of breast: Secondary | ICD-10-CM | POA: Diagnosis not present

## 2020-10-07 ENCOUNTER — Ambulatory Visit
Admission: RE | Admit: 2020-10-07 | Discharge: 2020-10-07 | Disposition: A | Discharge status: Home / Self Care | Payer: BC Managed Care – PPO | Source: Ambulatory Visit | Attending: Internal Medicine | Admitting: Internal Medicine

## 2020-10-07 DIAGNOSIS — E041 Nontoxic single thyroid nodule: Secondary | ICD-10-CM

## 2020-10-21 DIAGNOSIS — N200 Calculus of kidney: Secondary | ICD-10-CM | POA: Diagnosis not present

## 2020-10-21 DIAGNOSIS — I10 Essential (primary) hypertension: Secondary | ICD-10-CM | POA: Diagnosis not present

## 2020-10-21 DIAGNOSIS — S098XXS Other specified injuries of head, sequela: Secondary | ICD-10-CM | POA: Diagnosis not present

## 2020-10-21 DIAGNOSIS — G43111 Migraine with aura, intractable, with status migrainosus: Secondary | ICD-10-CM | POA: Diagnosis not present

## 2020-10-21 DIAGNOSIS — K589 Irritable bowel syndrome without diarrhea: Secondary | ICD-10-CM | POA: Diagnosis not present

## 2020-10-21 DIAGNOSIS — Z905 Acquired absence of kidney: Secondary | ICD-10-CM | POA: Diagnosis not present

## 2020-10-21 DIAGNOSIS — X58XXXS Exposure to other specified factors, sequela: Secondary | ICD-10-CM | POA: Diagnosis not present

## 2020-10-21 DIAGNOSIS — I719 Aortic aneurysm of unspecified site, without rupture: Secondary | ICD-10-CM | POA: Diagnosis not present

## 2020-10-21 DIAGNOSIS — Z87891 Personal history of nicotine dependence: Secondary | ICD-10-CM | POA: Diagnosis not present

## 2020-10-21 DIAGNOSIS — F411 Generalized anxiety disorder: Secondary | ICD-10-CM | POA: Diagnosis not present

## 2020-10-21 DIAGNOSIS — F4310 Post-traumatic stress disorder, unspecified: Secondary | ICD-10-CM | POA: Diagnosis not present

## 2021-03-03 ENCOUNTER — Other Ambulatory Visit: Payer: BC Managed Care – PPO

## 2021-03-03 ENCOUNTER — Other Ambulatory Visit: Payer: Self-pay | Admitting: Cardiology

## 2021-03-03 DIAGNOSIS — I77819 Aortic ectasia, unspecified site: Secondary | ICD-10-CM

## 2021-03-03 DIAGNOSIS — I201 Angina pectoris with documented spasm: Secondary | ICD-10-CM

## 2021-03-04 ENCOUNTER — Other Ambulatory Visit: Payer: BC Managed Care – PPO

## 2021-03-13 ENCOUNTER — Ambulatory Visit: Payer: BC Managed Care – PPO | Admitting: Cardiology

## 2021-03-17 ENCOUNTER — Other Ambulatory Visit: Payer: Self-pay

## 2021-03-17 ENCOUNTER — Ambulatory Visit: Payer: 59

## 2021-03-17 DIAGNOSIS — I77819 Aortic ectasia, unspecified site: Secondary | ICD-10-CM

## 2021-03-17 DIAGNOSIS — Z0271 Encounter for disability determination: Secondary | ICD-10-CM

## 2021-03-17 DIAGNOSIS — I201 Angina pectoris with documented spasm: Secondary | ICD-10-CM

## 2021-03-20 ENCOUNTER — Ambulatory Visit: Payer: BC Managed Care – PPO | Admitting: Cardiology

## 2021-03-29 NOTE — Progress Notes (Signed)
Normal LV systolic function.  Stable ascending aortic dilatation compared to 12/12/2019.  Will discuss on office visit.

## 2021-04-02 ENCOUNTER — Ambulatory Visit: Payer: BC Managed Care – PPO | Admitting: Cardiology

## 2021-04-10 ENCOUNTER — Other Ambulatory Visit: Payer: Self-pay

## 2021-04-15 ENCOUNTER — Encounter: Payer: Self-pay | Admitting: Cardiology

## 2021-04-15 ENCOUNTER — Ambulatory Visit: Payer: 59 | Admitting: Cardiology

## 2021-04-15 ENCOUNTER — Other Ambulatory Visit: Payer: Self-pay

## 2021-04-15 VITALS — BP 112/79 | HR 98 | Temp 98.0°F | Resp 16 | Ht 61.0 in | Wt 172.6 lb

## 2021-04-15 DIAGNOSIS — I201 Angina pectoris with documented spasm: Secondary | ICD-10-CM

## 2021-04-15 DIAGNOSIS — I77819 Aortic ectasia, unspecified site: Secondary | ICD-10-CM

## 2021-04-15 DIAGNOSIS — E7800 Pure hypercholesterolemia, unspecified: Secondary | ICD-10-CM

## 2021-04-15 DIAGNOSIS — Z905 Acquired absence of kidney: Secondary | ICD-10-CM

## 2021-04-15 MED ORDER — AMLODIPINE BESYLATE 5 MG PO TABS: ORAL_TABLET | ORAL | 7 refills | Status: DC
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
## 2021-05-12 IMAGING — CT CT CHEST W/O CM
1 series · 15 of 34 positions shown, 19 images · non-contrast
Comparison: None available at the time of this dictation.

CLINICAL DATA: Reported history of pulmonary nodules on outside
imaging. History of left nephrectomy. No reported history of cancer.
No current symptoms reported. Former smoker.

EXAM:
CT CHEST WITHOUT CONTRAST
TECHNIQUE: Multidetector CT imaging of the chest was performed following the
standard protocol without IV contrast.

[Series 2: chest w/(date) · axial · 0.74mm/px · z∈[-292,-44]mm · 15 of 146 slices shown, 19 images]
[im 11/146  mediastinal]
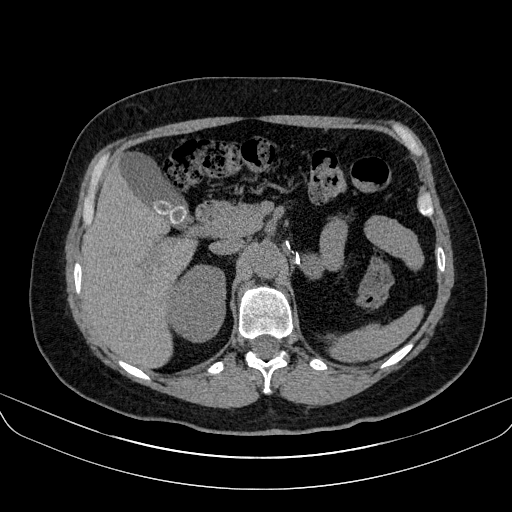
[im 11/146  lung]
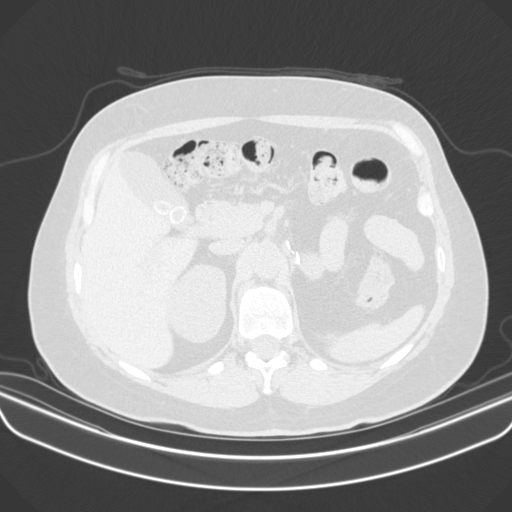
[im 22/146  lung]
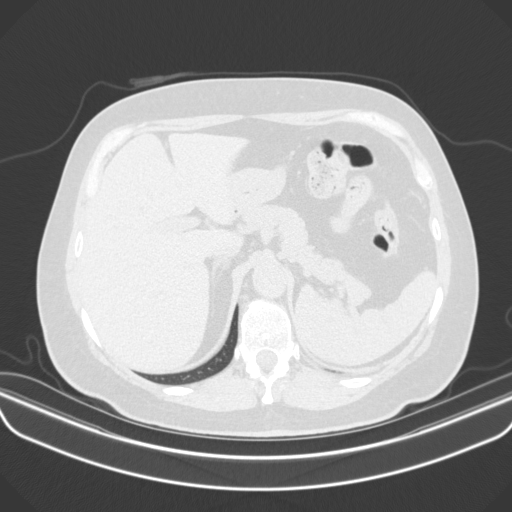
[im 30/146  lung]
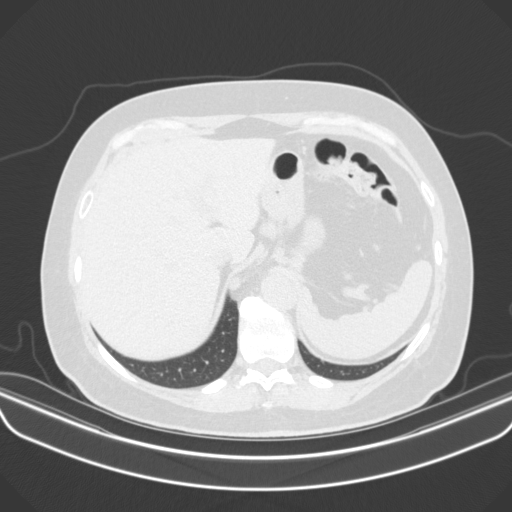
[im 38/146  lung]
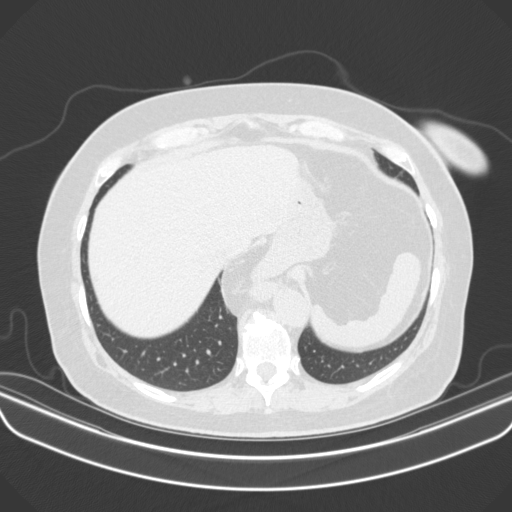
[im 49/146  mediastinal]
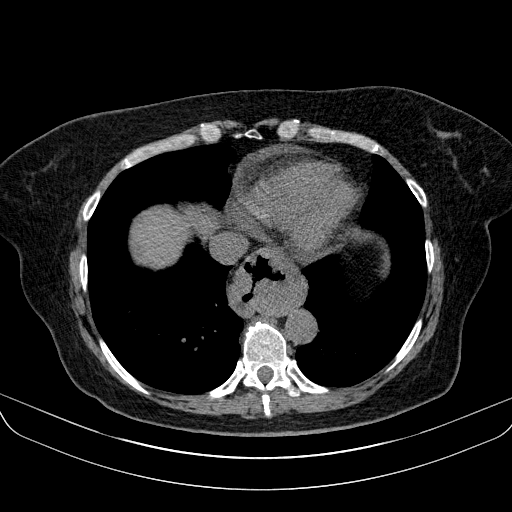
[im 49/146  lung]
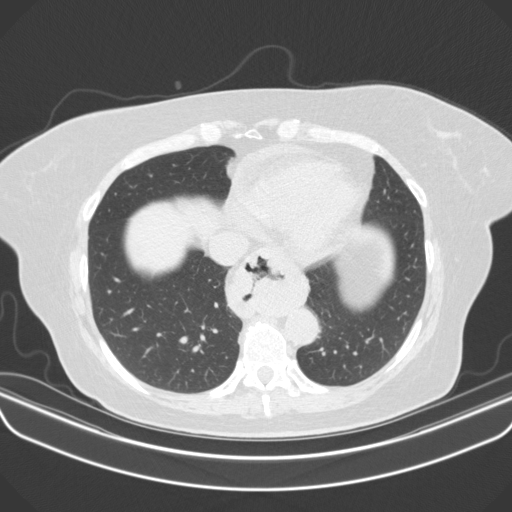
[im 59/146  lung]
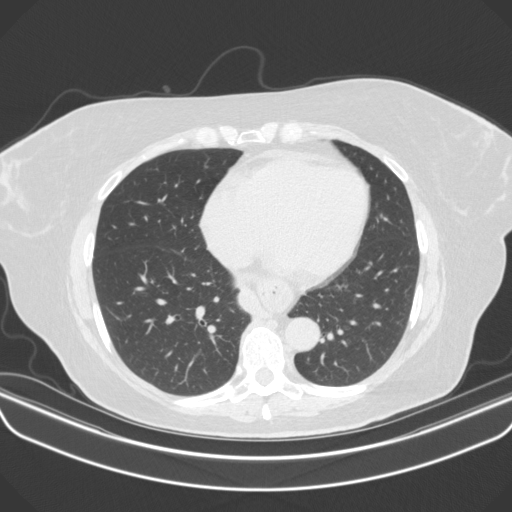
[im 65/146  lung]
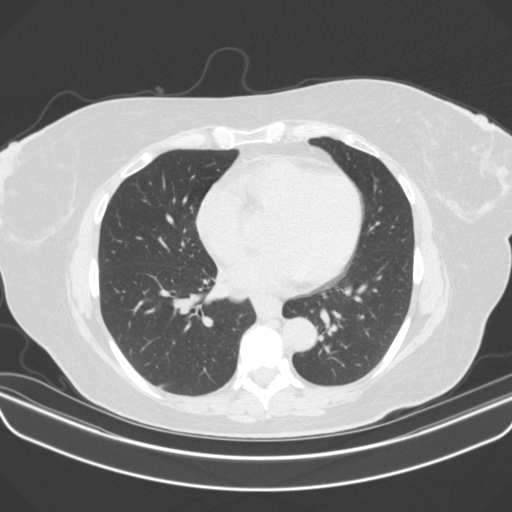
[im 76/146  lung]
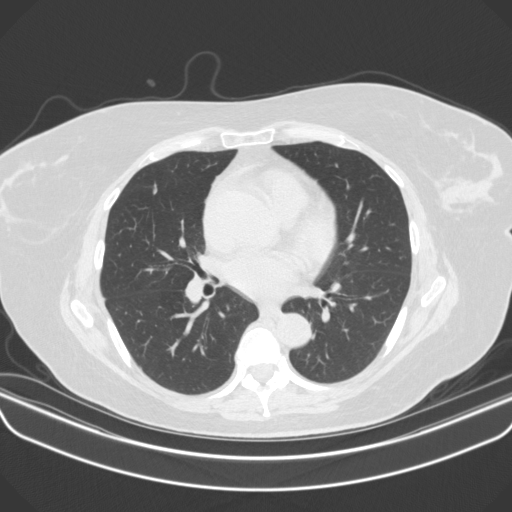
[im 81/146  mediastinal]
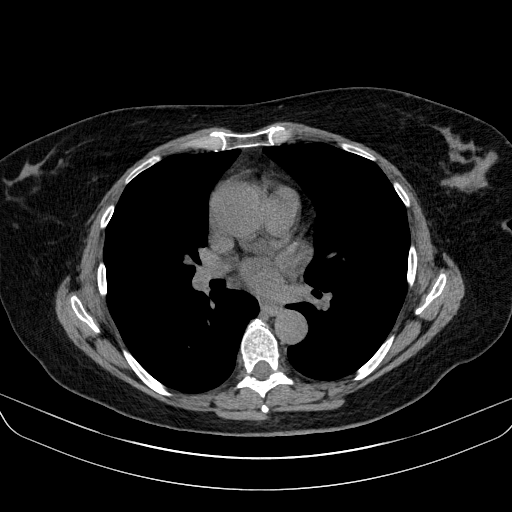
[im 81/146  lung]
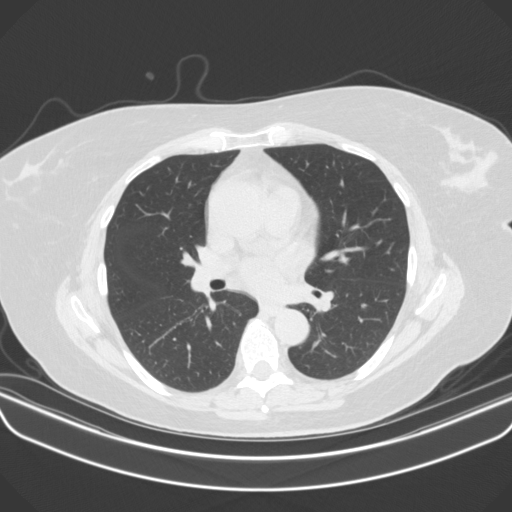
[im 88/146  lung]
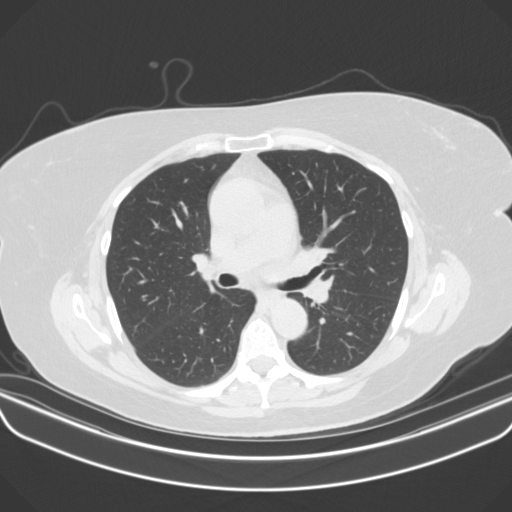
[im 97/146  lung]
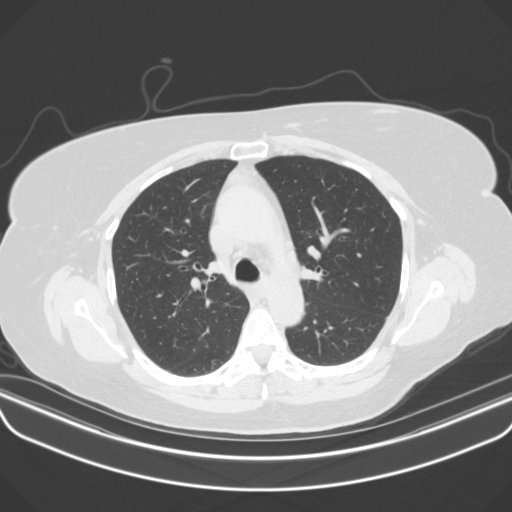
[im 108/146  lung]
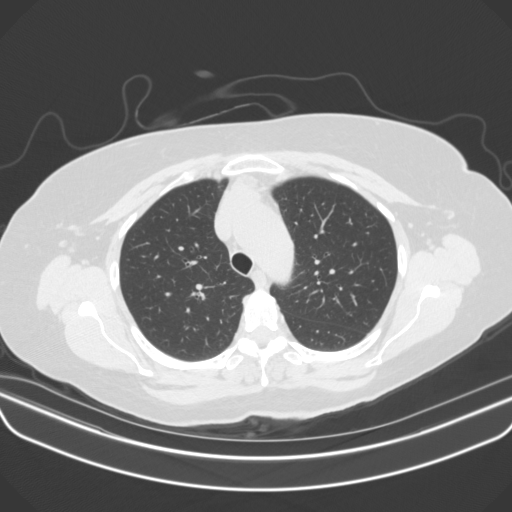
[im 117/146  mediastinal]
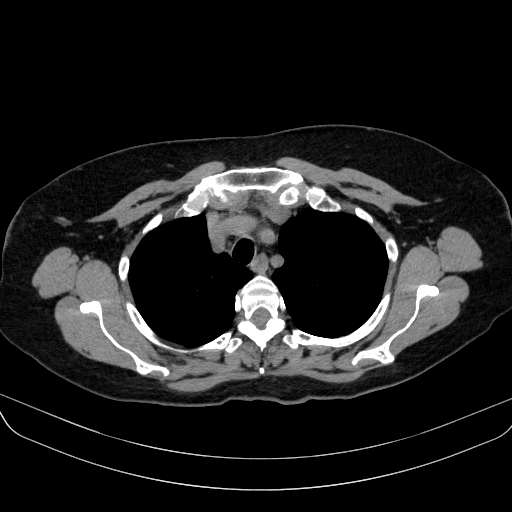
[im 117/146  lung]
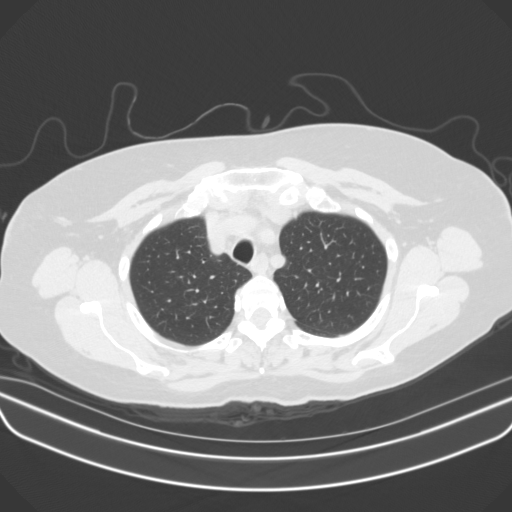
[im 124/146  lung]
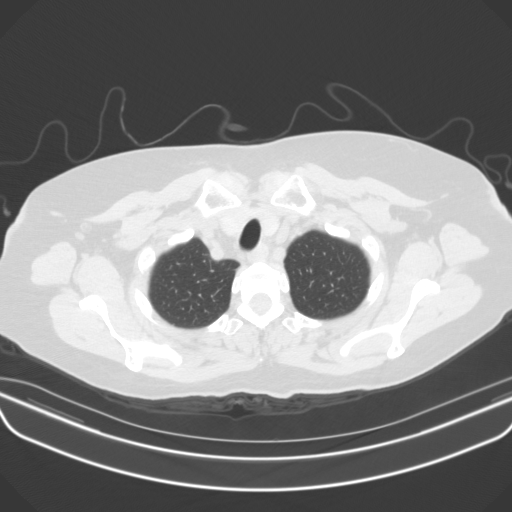
[im 135/146  lung]
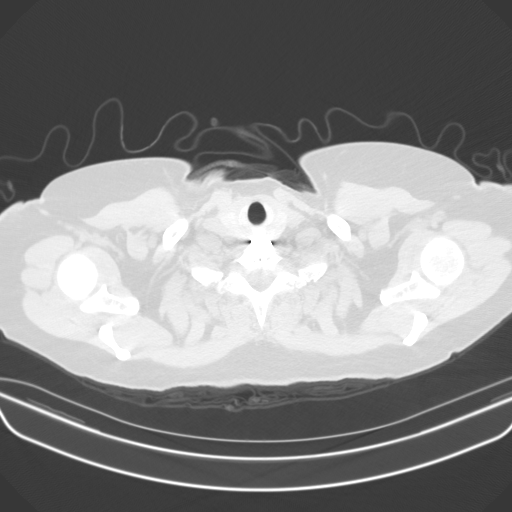

[15 of 34 positions shown; findings below may reference images not displayed]

FINDINGS: Cardiovascular: Normal heart size. Trace pericardial
effusion/thickening. Minimally atherosclerotic thoracic aorta with
ectatic 4.2 cm ascending thoracic aorta. Normal caliber pulmonary
arteries.

Mediastinum/Nodes: No discrete thyroid nodules. Unremarkable
esophagus. No pathologically enlarged axillary, mediastinal or hilar
lymph nodes, noting limited sensitivity for the detection of hilar
adenopathy on this noncontrast study. Small triangular focus of
interspersed fat and soft tissue density in anterior mediastinum is
compatible with atrophic thymus.

Lungs/Pleura: No pneumothorax. No pleural effusion. No acute
consolidative airspace disease or lung masses. Tiny 2 mm calcified
granulomas in the right middle lobe and peripheral right lower lobe.
No significant pulmonary nodules.

Upper abdomen: Moderate hiatal hernia. Nondistended stomach. Right
liver dome course granulomatous calcification. Cholelithiasis.
Partial visualization of postsurgical changes from left nephrectomy.

Musculoskeletal: No aggressive appearing focal osseous lesions.
Moderate thoracic spondylosis. Partially visualized surgical
hardware from ACDF in the lower cervical spine.
IMPRESSION: 1. Tiny calcified granulomas in the right lung. No significant
pulmonary nodules. No active pulmonary disease.
2. Ectatic 4.2 cm ascending thoracic aorta. Recommend annual imaging
followup by CTA or MRA. This recommendation follows 3696
ACCF/AHA/AATS/ACR/ASA/SCA/RTOYOTA/HIDESHI/JOEL/AH LIAN Guidelines for the
Diagnosis and Management of Patients with Thoracic Aortic Disease.
Circulation. 3696; 121: E266-e369. Aortic aneurysm NOS
(DT91R-40L.F).
3. Moderate hiatal hernia.
4. Cholelithiasis.

Aortic Atherosclerosis (DT91R-5A8.8).

## 2021-06-12 ENCOUNTER — Other Ambulatory Visit: Payer: Self-pay | Admitting: Cardiology

## 2021-06-12 DIAGNOSIS — I201 Angina pectoris with documented spasm: Secondary | ICD-10-CM

## 2021-07-12 ENCOUNTER — Other Ambulatory Visit: Payer: Self-pay | Admitting: Cardiology

## 2021-07-12 DIAGNOSIS — I201 Angina pectoris with documented spasm: Secondary | ICD-10-CM

## 2021-07-24 DIAGNOSIS — G43009 Migraine without aura, not intractable, without status migrainosus: Secondary | ICD-10-CM | POA: Diagnosis not present

## 2021-07-24 DIAGNOSIS — H04123 Dry eye syndrome of bilateral lacrimal glands: Secondary | ICD-10-CM | POA: Diagnosis not present

## 2021-07-24 DIAGNOSIS — H524 Presbyopia: Secondary | ICD-10-CM | POA: Diagnosis not present

## 2021-07-24 DIAGNOSIS — H35033 Hypertensive retinopathy, bilateral: Secondary | ICD-10-CM | POA: Diagnosis not present

## 2021-08-01 DIAGNOSIS — Z905 Acquired absence of kidney: Secondary | ICD-10-CM | POA: Diagnosis not present

## 2021-08-01 DIAGNOSIS — K8020 Calculus of gallbladder without cholecystitis without obstruction: Secondary | ICD-10-CM | POA: Diagnosis not present

## 2021-08-01 DIAGNOSIS — E041 Nontoxic single thyroid nodule: Secondary | ICD-10-CM | POA: Diagnosis not present

## 2021-08-01 DIAGNOSIS — E782 Mixed hyperlipidemia: Secondary | ICD-10-CM | POA: Diagnosis not present

## 2021-08-05 DIAGNOSIS — G43111 Migraine with aura, intractable, with status migrainosus: Secondary | ICD-10-CM | POA: Diagnosis not present

## 2021-08-05 DIAGNOSIS — G43719 Chronic migraine without aura, intractable, without status migrainosus: Secondary | ICD-10-CM | POA: Diagnosis not present

## 2021-08-06 ENCOUNTER — Other Ambulatory Visit: Payer: Self-pay | Admitting: Internal Medicine

## 2021-08-06 DIAGNOSIS — R1011 Right upper quadrant pain: Secondary | ICD-10-CM | POA: Diagnosis not present

## 2021-08-06 DIAGNOSIS — D751 Secondary polycythemia: Secondary | ICD-10-CM | POA: Diagnosis not present

## 2021-08-06 DIAGNOSIS — E785 Hyperlipidemia, unspecified: Secondary | ICD-10-CM | POA: Diagnosis not present

## 2021-08-11 ENCOUNTER — Other Ambulatory Visit: Payer: Self-pay | Admitting: Cardiology

## 2021-08-11 DIAGNOSIS — I201 Angina pectoris with documented spasm: Secondary | ICD-10-CM

## 2021-08-14 ENCOUNTER — Ambulatory Visit
Admission: RE | Admit: 2021-08-14 | Discharge: 2021-08-14 | Disposition: A | Discharge status: Home / Self Care | Payer: BC Managed Care – PPO | Source: Ambulatory Visit | Attending: Internal Medicine | Admitting: Internal Medicine

## 2021-08-14 DIAGNOSIS — K760 Fatty (change of) liver, not elsewhere classified: Secondary | ICD-10-CM | POA: Diagnosis not present

## 2021-08-26 ENCOUNTER — Other Ambulatory Visit (HOSPITAL_COMMUNITY): Payer: Self-pay | Admitting: Gastroenterology

## 2021-08-26 ENCOUNTER — Other Ambulatory Visit: Payer: Self-pay | Admitting: Gastroenterology

## 2021-08-26 DIAGNOSIS — E669 Obesity, unspecified: Secondary | ICD-10-CM | POA: Diagnosis not present

## 2021-08-26 DIAGNOSIS — K760 Fatty (change of) liver, not elsewhere classified: Secondary | ICD-10-CM

## 2021-08-26 DIAGNOSIS — Z86010 Personal history of colonic polyps: Secondary | ICD-10-CM | POA: Diagnosis not present

## 2021-08-26 DIAGNOSIS — R933 Abnormal findings on diagnostic imaging of other parts of digestive tract: Secondary | ICD-10-CM | POA: Diagnosis not present

## 2021-09-02 ENCOUNTER — Ambulatory Visit (HOSPITAL_COMMUNITY)
Admission: RE | Admit: 2021-09-02 | Discharge: 2021-09-02 | Disposition: A | Discharge status: Home / Self Care | Payer: BC Managed Care – PPO | Source: Ambulatory Visit | Attending: Gastroenterology | Admitting: Gastroenterology

## 2021-09-02 ENCOUNTER — Other Ambulatory Visit: Payer: Self-pay

## 2021-09-02 DIAGNOSIS — K760 Fatty (change of) liver, not elsewhere classified: Secondary | ICD-10-CM | POA: Insufficient documentation

## 2021-09-02 DIAGNOSIS — K7689 Other specified diseases of liver: Secondary | ICD-10-CM | POA: Diagnosis not present

## 2021-09-10 ENCOUNTER — Other Ambulatory Visit: Payer: Self-pay | Admitting: Cardiology

## 2021-09-10 DIAGNOSIS — I201 Angina pectoris with documented spasm: Secondary | ICD-10-CM

## 2021-10-10 ENCOUNTER — Other Ambulatory Visit: Payer: Self-pay | Admitting: Cardiology

## 2021-10-10 DIAGNOSIS — I201 Angina pectoris with documented spasm: Secondary | ICD-10-CM

## 2021-10-31 ENCOUNTER — Other Ambulatory Visit: Payer: Self-pay | Admitting: Cardiology

## 2021-10-31 DIAGNOSIS — I201 Angina pectoris with documented spasm: Secondary | ICD-10-CM

## 2021-11-30 ENCOUNTER — Other Ambulatory Visit: Payer: Self-pay | Admitting: Cardiology

## 2021-11-30 DIAGNOSIS — I201 Angina pectoris with documented spasm: Secondary | ICD-10-CM

## 2021-12-19 DIAGNOSIS — M542 Cervicalgia: Secondary | ICD-10-CM | POA: Diagnosis not present

## 2021-12-19 DIAGNOSIS — K760 Fatty (change of) liver, not elsewhere classified: Secondary | ICD-10-CM | POA: Diagnosis not present

## 2021-12-19 DIAGNOSIS — J302 Other seasonal allergic rhinitis: Secondary | ICD-10-CM | POA: Diagnosis not present

## 2021-12-19 DIAGNOSIS — E782 Mixed hyperlipidemia: Secondary | ICD-10-CM | POA: Diagnosis not present

## 2021-12-30 ENCOUNTER — Other Ambulatory Visit: Payer: Self-pay | Admitting: Cardiology

## 2021-12-30 DIAGNOSIS — I201 Angina pectoris with documented spasm: Secondary | ICD-10-CM

## 2022-01-15 ENCOUNTER — Other Ambulatory Visit: Payer: Self-pay | Admitting: Internal Medicine

## 2022-01-15 DIAGNOSIS — Z1239 Encounter for other screening for malignant neoplasm of breast: Secondary | ICD-10-CM

## 2022-02-03 DIAGNOSIS — I10 Essential (primary) hypertension: Secondary | ICD-10-CM | POA: Diagnosis not present

## 2022-02-03 DIAGNOSIS — I719 Aortic aneurysm of unspecified site, without rupture: Secondary | ICD-10-CM | POA: Diagnosis not present

## 2022-02-03 DIAGNOSIS — K589 Irritable bowel syndrome without diarrhea: Secondary | ICD-10-CM | POA: Diagnosis not present

## 2022-02-03 DIAGNOSIS — N200 Calculus of kidney: Secondary | ICD-10-CM | POA: Diagnosis not present

## 2022-02-03 DIAGNOSIS — G43111 Migraine with aura, intractable, with status migrainosus: Secondary | ICD-10-CM | POA: Diagnosis not present

## 2022-02-03 DIAGNOSIS — F411 Generalized anxiety disorder: Secondary | ICD-10-CM | POA: Diagnosis not present

## 2022-02-03 DIAGNOSIS — S098XXS Other specified injuries of head, sequela: Secondary | ICD-10-CM | POA: Diagnosis not present

## 2022-02-03 DIAGNOSIS — G43119 Migraine with aura, intractable, without status migrainosus: Secondary | ICD-10-CM | POA: Diagnosis not present

## 2022-02-03 DIAGNOSIS — H9311 Tinnitus, right ear: Secondary | ICD-10-CM | POA: Diagnosis not present

## 2022-02-03 DIAGNOSIS — H9201 Otalgia, right ear: Secondary | ICD-10-CM | POA: Diagnosis not present

## 2022-02-03 DIAGNOSIS — Z905 Acquired absence of kidney: Secondary | ICD-10-CM | POA: Diagnosis not present

## 2022-02-03 DIAGNOSIS — F4310 Post-traumatic stress disorder, unspecified: Secondary | ICD-10-CM | POA: Diagnosis not present

## 2022-02-26 ENCOUNTER — Ambulatory Visit
Admission: RE | Admit: 2022-02-26 | Discharge: 2022-02-26 | Disposition: A | Discharge status: Home / Self Care | Payer: BC Managed Care – PPO | Source: Ambulatory Visit | Attending: Internal Medicine | Admitting: Internal Medicine

## 2022-02-26 DIAGNOSIS — Z1239 Encounter for other screening for malignant neoplasm of breast: Secondary | ICD-10-CM

## 2022-02-26 DIAGNOSIS — Z1231 Encounter for screening mammogram for malignant neoplasm of breast: Secondary | ICD-10-CM | POA: Diagnosis not present

## 2022-02-28 ENCOUNTER — Other Ambulatory Visit: Payer: Self-pay | Admitting: Cardiology

## 2022-02-28 DIAGNOSIS — I201 Angina pectoris with documented spasm: Secondary | ICD-10-CM

## 2022-03-13 DIAGNOSIS — E782 Mixed hyperlipidemia: Secondary | ICD-10-CM | POA: Diagnosis not present

## 2022-03-13 DIAGNOSIS — Z0001 Encounter for general adult medical examination with abnormal findings: Secondary | ICD-10-CM | POA: Diagnosis not present

## 2022-03-25 DIAGNOSIS — Z01419 Encounter for gynecological examination (general) (routine) without abnormal findings: Secondary | ICD-10-CM | POA: Diagnosis not present

## 2022-03-25 DIAGNOSIS — G4733 Obstructive sleep apnea (adult) (pediatric): Secondary | ICD-10-CM | POA: Diagnosis not present

## 2022-03-25 DIAGNOSIS — I77810 Thoracic aortic ectasia: Secondary | ICD-10-CM | POA: Diagnosis not present

## 2022-03-25 DIAGNOSIS — Z0001 Encounter for general adult medical examination with abnormal findings: Secondary | ICD-10-CM | POA: Diagnosis not present

## 2022-03-25 DIAGNOSIS — Z1239 Encounter for other screening for malignant neoplasm of breast: Secondary | ICD-10-CM | POA: Diagnosis not present

## 2022-03-25 DIAGNOSIS — K760 Fatty (change of) liver, not elsewhere classified: Secondary | ICD-10-CM | POA: Diagnosis not present

## 2022-03-25 DIAGNOSIS — R5383 Other fatigue: Secondary | ICD-10-CM | POA: Diagnosis not present

## 2022-03-25 DIAGNOSIS — Z23 Encounter for immunization: Secondary | ICD-10-CM | POA: Diagnosis not present

## 2022-03-26 ENCOUNTER — Other Ambulatory Visit: Payer: Self-pay | Admitting: Internal Medicine

## 2022-03-26 DIAGNOSIS — R918 Other nonspecific abnormal finding of lung field: Secondary | ICD-10-CM

## 2022-03-26 DIAGNOSIS — E2839 Other primary ovarian failure: Secondary | ICD-10-CM

## 2022-03-27 ENCOUNTER — Other Ambulatory Visit: Payer: Self-pay | Admitting: Internal Medicine

## 2022-03-27 DIAGNOSIS — K760 Fatty (change of) liver, not elsewhere classified: Secondary | ICD-10-CM

## 2022-03-27 DIAGNOSIS — E041 Nontoxic single thyroid nodule: Secondary | ICD-10-CM

## 2022-03-30 ENCOUNTER — Other Ambulatory Visit: Payer: Self-pay | Admitting: Cardiology

## 2022-03-30 DIAGNOSIS — I201 Angina pectoris with documented spasm: Secondary | ICD-10-CM

## 2022-04-22 ENCOUNTER — Ambulatory Visit
Admission: RE | Admit: 2022-04-22 | Discharge: 2022-04-22 | Disposition: A | Discharge status: Home / Self Care | Payer: BC Managed Care – PPO | Source: Ambulatory Visit | Attending: Internal Medicine | Admitting: Internal Medicine

## 2022-04-22 DIAGNOSIS — E041 Nontoxic single thyroid nodule: Secondary | ICD-10-CM | POA: Diagnosis not present

## 2022-04-22 DIAGNOSIS — R918 Other nonspecific abnormal finding of lung field: Secondary | ICD-10-CM

## 2022-04-22 DIAGNOSIS — R911 Solitary pulmonary nodule: Secondary | ICD-10-CM | POA: Diagnosis not present

## 2022-04-29 ENCOUNTER — Other Ambulatory Visit: Payer: Self-pay | Admitting: Cardiology

## 2022-04-29 DIAGNOSIS — I201 Angina pectoris with documented spasm: Secondary | ICD-10-CM

## 2022-04-30 ENCOUNTER — Ambulatory Visit
Admission: RE | Admit: 2022-04-30 | Discharge: 2022-04-30 | Disposition: A | Discharge status: Home / Self Care | Payer: BC Managed Care – PPO | Source: Ambulatory Visit | Attending: Internal Medicine | Admitting: Internal Medicine

## 2022-04-30 DIAGNOSIS — Z780 Asymptomatic menopausal state: Secondary | ICD-10-CM | POA: Diagnosis not present

## 2022-04-30 DIAGNOSIS — E2839 Other primary ovarian failure: Secondary | ICD-10-CM

## 2022-04-30 DIAGNOSIS — M8589 Other specified disorders of bone density and structure, multiple sites: Secondary | ICD-10-CM | POA: Diagnosis not present

## 2022-05-01 ENCOUNTER — Other Ambulatory Visit: Payer: Self-pay

## 2022-05-01 ENCOUNTER — Telehealth: Payer: Self-pay

## 2022-05-01 ENCOUNTER — Other Ambulatory Visit: Payer: Self-pay | Admitting: Cardiology

## 2022-05-01 DIAGNOSIS — R918 Other nonspecific abnormal finding of lung field: Secondary | ICD-10-CM | POA: Diagnosis not present

## 2022-05-01 DIAGNOSIS — I201 Angina pectoris with documented spasm: Secondary | ICD-10-CM

## 2022-05-01 DIAGNOSIS — E041 Nontoxic single thyroid nodule: Secondary | ICD-10-CM | POA: Diagnosis not present

## 2022-05-01 DIAGNOSIS — M8580 Other specified disorders of bone density and structure, unspecified site: Secondary | ICD-10-CM | POA: Diagnosis not present

## 2022-05-01 DIAGNOSIS — I712 Thoracic aortic aneurysm, without rupture, unspecified: Secondary | ICD-10-CM | POA: Diagnosis not present

## 2022-05-01 MED ORDER — AMLODIPINE BESYLATE 5 MG PO TABS: 5.0000 mg | ORAL_TABLET | Freq: Every day | ORAL | 3 refills | Status: DC

## 2022-05-04 ENCOUNTER — Other Ambulatory Visit: Payer: Self-pay | Admitting: Internal Medicine

## 2022-05-04 DIAGNOSIS — E041 Nontoxic single thyroid nodule: Secondary | ICD-10-CM

## 2022-05-05 DIAGNOSIS — X58XXXS Exposure to other specified factors, sequela: Secondary | ICD-10-CM | POA: Diagnosis not present

## 2022-05-05 DIAGNOSIS — H9191 Unspecified hearing loss, right ear: Secondary | ICD-10-CM | POA: Diagnosis not present

## 2022-05-05 DIAGNOSIS — F4310 Post-traumatic stress disorder, unspecified: Secondary | ICD-10-CM | POA: Diagnosis not present

## 2022-05-05 DIAGNOSIS — I719 Aortic aneurysm of unspecified site, without rupture: Secondary | ICD-10-CM | POA: Diagnosis not present

## 2022-05-05 DIAGNOSIS — T1490XS Injury, unspecified, sequela: Secondary | ICD-10-CM | POA: Diagnosis not present

## 2022-05-05 DIAGNOSIS — H9201 Otalgia, right ear: Secondary | ICD-10-CM | POA: Diagnosis not present

## 2022-05-05 DIAGNOSIS — F411 Generalized anxiety disorder: Secondary | ICD-10-CM | POA: Diagnosis not present

## 2022-05-05 DIAGNOSIS — G43111 Migraine with aura, intractable, with status migrainosus: Secondary | ICD-10-CM | POA: Diagnosis not present

## 2022-05-05 DIAGNOSIS — E161 Other hypoglycemia: Secondary | ICD-10-CM | POA: Diagnosis not present

## 2022-05-05 DIAGNOSIS — I10 Essential (primary) hypertension: Secondary | ICD-10-CM | POA: Diagnosis not present

## 2022-05-05 DIAGNOSIS — K589 Irritable bowel syndrome without diarrhea: Secondary | ICD-10-CM | POA: Diagnosis not present

## 2022-05-05 DIAGNOSIS — N200 Calculus of kidney: Secondary | ICD-10-CM | POA: Diagnosis not present

## 2022-05-05 DIAGNOSIS — Z79899 Other long term (current) drug therapy: Secondary | ICD-10-CM | POA: Diagnosis not present

## 2022-05-05 DIAGNOSIS — Z905 Acquired absence of kidney: Secondary | ICD-10-CM | POA: Diagnosis not present

## 2022-05-05 DIAGNOSIS — R4189 Other symptoms and signs involving cognitive functions and awareness: Secondary | ICD-10-CM | POA: Diagnosis not present

## 2022-06-10 DIAGNOSIS — S0990XS Unspecified injury of head, sequela: Secondary | ICD-10-CM | POA: Diagnosis not present

## 2022-06-10 DIAGNOSIS — G43111 Migraine with aura, intractable, with status migrainosus: Secondary | ICD-10-CM | POA: Diagnosis not present

## 2022-06-10 DIAGNOSIS — H9311 Tinnitus, right ear: Secondary | ICD-10-CM | POA: Diagnosis not present

## 2022-06-10 DIAGNOSIS — X58XXXS Exposure to other specified factors, sequela: Secondary | ICD-10-CM | POA: Diagnosis not present

## 2022-06-10 DIAGNOSIS — H905 Unspecified sensorineural hearing loss: Secondary | ICD-10-CM | POA: Diagnosis not present

## 2022-07-09 DIAGNOSIS — F418 Other specified anxiety disorders: Secondary | ICD-10-CM | POA: Diagnosis not present

## 2022-07-09 DIAGNOSIS — G4709 Other insomnia: Secondary | ICD-10-CM | POA: Diagnosis not present

## 2022-07-09 DIAGNOSIS — G43909 Migraine, unspecified, not intractable, without status migrainosus: Secondary | ICD-10-CM | POA: Diagnosis not present

## 2022-07-09 DIAGNOSIS — Z733 Stress, not elsewhere classified: Secondary | ICD-10-CM | POA: Diagnosis not present

## 2022-07-22 DIAGNOSIS — R509 Fever, unspecified: Secondary | ICD-10-CM | POA: Diagnosis not present

## 2022-07-22 DIAGNOSIS — U071 COVID-19: Secondary | ICD-10-CM | POA: Diagnosis not present

## 2022-07-22 DIAGNOSIS — R059 Cough, unspecified: Secondary | ICD-10-CM | POA: Diagnosis not present

## 2022-07-22 DIAGNOSIS — R0981 Nasal congestion: Secondary | ICD-10-CM | POA: Diagnosis not present

## 2022-07-28 ENCOUNTER — Other Ambulatory Visit: Payer: BC Managed Care – PPO

## 2022-08-03 ENCOUNTER — Ambulatory Visit: Payer: BC Managed Care – PPO | Admitting: Cardiology

## 2022-08-03 ENCOUNTER — Encounter: Payer: Self-pay | Admitting: Cardiology

## 2022-08-03 VITALS — BP 131/81 | HR 76 | Temp 96.7°F | Resp 16 | Ht 61.0 in | Wt 176.4 lb

## 2022-08-03 DIAGNOSIS — I201 Angina pectoris with documented spasm: Secondary | ICD-10-CM | POA: Diagnosis not present

## 2022-08-03 DIAGNOSIS — E7800 Pure hypercholesterolemia, unspecified: Secondary | ICD-10-CM | POA: Diagnosis not present

## 2022-08-03 DIAGNOSIS — I77819 Aortic ectasia, unspecified site: Secondary | ICD-10-CM | POA: Diagnosis not present

## 2022-08-03 MED ORDER — NITROGLYCERIN 0.4 MG SL SUBL: 0.4000 mg | SUBLINGUAL_TABLET | SUBLINGUAL | 3 refills | Status: DC | PRN

## 2022-08-03 MED ORDER — LOSARTAN POTASSIUM 25 MG PO TABS: 25.0000 mg | ORAL_TABLET | Freq: Every evening | ORAL | 3 refills | Status: DC
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
## 2022-08-13 DIAGNOSIS — G43719 Chronic migraine without aura, intractable, without status migrainosus: Secondary | ICD-10-CM | POA: Diagnosis not present

## 2022-08-19 ENCOUNTER — Encounter: Payer: Self-pay | Admitting: Cardiology

## 2022-08-19 NOTE — Telephone Encounter (Signed)
From patient.

## 2022-08-24 ENCOUNTER — Ambulatory Visit: Payer: BC Managed Care – PPO

## 2022-08-24 DIAGNOSIS — I951 Orthostatic hypotension: Secondary | ICD-10-CM | POA: Diagnosis not present

## 2022-08-24 DIAGNOSIS — I201 Angina pectoris with documented spasm: Secondary | ICD-10-CM | POA: Diagnosis not present

## 2022-08-24 DIAGNOSIS — I77819 Aortic ectasia, unspecified site: Secondary | ICD-10-CM

## 2022-08-24 DIAGNOSIS — I10 Essential (primary) hypertension: Secondary | ICD-10-CM | POA: Diagnosis not present

## 2022-08-24 DIAGNOSIS — R42 Dizziness and giddiness: Secondary | ICD-10-CM | POA: Diagnosis not present

## 2022-08-24 MED ORDER — ATENOLOL 25 MG PO TABS: 25.0000 mg | ORAL_TABLET | Freq: Every evening | ORAL | 3 refills | Status: DC

## 2022-08-24 MED ORDER — LOSARTAN POTASSIUM 50 MG PO TABS: 50.0000 mg | ORAL_TABLET | Freq: Every evening | ORAL | 3 refills | Status: DC

## 2022-08-24 NOTE — Telephone Encounter (Signed)
From patient.
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
## 2022-08-27 DIAGNOSIS — R152 Fecal urgency: Secondary | ICD-10-CM | POA: Diagnosis not present

## 2022-08-27 DIAGNOSIS — K760 Fatty (change of) liver, not elsewhere classified: Secondary | ICD-10-CM | POA: Diagnosis not present

## 2022-08-27 DIAGNOSIS — K5730 Diverticulosis of large intestine without perforation or abscess without bleeding: Secondary | ICD-10-CM | POA: Diagnosis not present

## 2022-08-27 DIAGNOSIS — K9089 Other intestinal malabsorption: Secondary | ICD-10-CM | POA: Diagnosis not present

## 2022-09-08 DIAGNOSIS — F418 Other specified anxiety disorders: Secondary | ICD-10-CM | POA: Diagnosis not present

## 2022-09-08 DIAGNOSIS — Z733 Stress, not elsewhere classified: Secondary | ICD-10-CM | POA: Diagnosis not present

## 2022-09-08 DIAGNOSIS — G43909 Migraine, unspecified, not intractable, without status migrainosus: Secondary | ICD-10-CM | POA: Diagnosis not present

## 2022-09-08 DIAGNOSIS — G4709 Other insomnia: Secondary | ICD-10-CM | POA: Diagnosis not present

## 2022-09-14 DIAGNOSIS — S52552A Other extraarticular fracture of lower end of left radius, initial encounter for closed fracture: Secondary | ICD-10-CM | POA: Diagnosis not present

## 2022-09-17 DIAGNOSIS — K760 Fatty (change of) liver, not elsewhere classified: Secondary | ICD-10-CM | POA: Diagnosis not present

## 2022-09-17 DIAGNOSIS — Z86010 Personal history of colonic polyps: Secondary | ICD-10-CM | POA: Diagnosis not present

## 2022-09-17 DIAGNOSIS — K449 Diaphragmatic hernia without obstruction or gangrene: Secondary | ICD-10-CM | POA: Diagnosis not present

## 2022-09-17 DIAGNOSIS — K9089 Other intestinal malabsorption: Secondary | ICD-10-CM | POA: Diagnosis not present

## 2022-09-23 DIAGNOSIS — M25532 Pain in left wrist: Secondary | ICD-10-CM | POA: Diagnosis not present

## 2022-09-30 NOTE — Telephone Encounter (Signed)
Error

## 2022-10-08 DIAGNOSIS — S52502A Unspecified fracture of the lower end of left radius, initial encounter for closed fracture: Secondary | ICD-10-CM | POA: Diagnosis not present

## 2022-10-12 ENCOUNTER — Ambulatory Visit: Payer: BC Managed Care – PPO | Admitting: Cardiology

## 2022-10-12 ENCOUNTER — Encounter: Payer: Self-pay | Admitting: Cardiology

## 2022-10-12 VITALS — BP 105/75 | HR 67 | Resp 16 | Ht 61.0 in | Wt 178.0 lb

## 2022-10-12 DIAGNOSIS — R002 Palpitations: Secondary | ICD-10-CM

## 2022-10-12 DIAGNOSIS — I201 Angina pectoris with documented spasm: Secondary | ICD-10-CM

## 2022-10-12 DIAGNOSIS — I77819 Aortic ectasia, unspecified site: Secondary | ICD-10-CM | POA: Diagnosis not present

## 2022-10-12 DIAGNOSIS — I952 Hypotension due to drugs: Secondary | ICD-10-CM | POA: Diagnosis not present

## 2022-10-12 MED ORDER — ATENOLOL 25 MG PO TABS: 25.0000 mg | ORAL_TABLET | Freq: Every evening | ORAL | 3 refills | Status: DC
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
## 2022-10-28 DIAGNOSIS — G43E09 Chronic migraine with aura, not intractable, without status migrainosus: Secondary | ICD-10-CM | POA: Diagnosis not present

## 2022-10-28 DIAGNOSIS — G43E19 Chronic migraine with aura, intractable, without status migrainosus: Secondary | ICD-10-CM | POA: Diagnosis not present

## 2022-11-04 DIAGNOSIS — S52502A Unspecified fracture of the lower end of left radius, initial encounter for closed fracture: Secondary | ICD-10-CM | POA: Diagnosis not present

## 2022-11-04 DIAGNOSIS — M25532 Pain in left wrist: Secondary | ICD-10-CM | POA: Diagnosis not present

## 2022-11-18 DIAGNOSIS — H903 Sensorineural hearing loss, bilateral: Secondary | ICD-10-CM | POA: Diagnosis not present

## 2022-12-09 DIAGNOSIS — G43009 Migraine without aura, not intractable, without status migrainosus: Secondary | ICD-10-CM | POA: Diagnosis not present

## 2022-12-09 DIAGNOSIS — G44309 Post-traumatic headache, unspecified, not intractable: Secondary | ICD-10-CM | POA: Diagnosis not present

## 2022-12-09 DIAGNOSIS — G43109 Migraine with aura, not intractable, without status migrainosus: Secondary | ICD-10-CM | POA: Diagnosis not present

## 2022-12-09 DIAGNOSIS — G4486 Cervicogenic headache: Secondary | ICD-10-CM | POA: Diagnosis not present

## 2022-12-24 ENCOUNTER — Emergency Department (HOSPITAL_BASED_OUTPATIENT_CLINIC_OR_DEPARTMENT_OTHER): Payer: BC Managed Care – PPO

## 2022-12-24 ENCOUNTER — Inpatient Hospital Stay (HOSPITAL_BASED_OUTPATIENT_CLINIC_OR_DEPARTMENT_OTHER)
Admission: EM | Admit: 2022-12-24 | Discharge: 2022-12-26 | DRG: 661 | Disposition: A | Discharge status: Home / Self Care | Payer: BC Managed Care – PPO | Attending: Family Medicine | Admitting: Family Medicine

## 2022-12-24 ENCOUNTER — Encounter (HOSPITAL_BASED_OUTPATIENT_CLINIC_OR_DEPARTMENT_OTHER): Payer: Self-pay

## 2022-12-24 ENCOUNTER — Other Ambulatory Visit: Payer: Self-pay

## 2022-12-24 DIAGNOSIS — Z9049 Acquired absence of other specified parts of digestive tract: Secondary | ICD-10-CM

## 2022-12-24 DIAGNOSIS — I10 Essential (primary) hypertension: Secondary | ICD-10-CM | POA: Insufficient documentation

## 2022-12-24 DIAGNOSIS — N1330 Unspecified hydronephrosis: Secondary | ICD-10-CM | POA: Diagnosis not present

## 2022-12-24 DIAGNOSIS — Z8249 Family history of ischemic heart disease and other diseases of the circulatory system: Secondary | ICD-10-CM | POA: Diagnosis not present

## 2022-12-24 DIAGNOSIS — Z79899 Other long term (current) drug therapy: Secondary | ICD-10-CM

## 2022-12-24 DIAGNOSIS — K449 Diaphragmatic hernia without obstruction or gangrene: Secondary | ICD-10-CM | POA: Diagnosis present

## 2022-12-24 DIAGNOSIS — Z8261 Family history of arthritis: Secondary | ICD-10-CM | POA: Diagnosis not present

## 2022-12-24 DIAGNOSIS — G4733 Obstructive sleep apnea (adult) (pediatric): Secondary | ICD-10-CM | POA: Diagnosis present

## 2022-12-24 DIAGNOSIS — Z87820 Personal history of traumatic brain injury: Secondary | ICD-10-CM | POA: Diagnosis not present

## 2022-12-24 DIAGNOSIS — F4310 Post-traumatic stress disorder, unspecified: Secondary | ICD-10-CM | POA: Diagnosis present

## 2022-12-24 DIAGNOSIS — Z981 Arthrodesis status: Secondary | ICD-10-CM | POA: Diagnosis not present

## 2022-12-24 DIAGNOSIS — Z905 Acquired absence of kidney: Secondary | ICD-10-CM | POA: Diagnosis not present

## 2022-12-24 DIAGNOSIS — I77810 Thoracic aortic ectasia: Secondary | ICD-10-CM | POA: Diagnosis not present

## 2022-12-24 DIAGNOSIS — N179 Acute kidney failure, unspecified: Secondary | ICD-10-CM | POA: Insufficient documentation

## 2022-12-24 DIAGNOSIS — Z888 Allergy status to other drugs, medicaments and biological substances status: Secondary | ICD-10-CM | POA: Diagnosis not present

## 2022-12-24 DIAGNOSIS — Z882 Allergy status to sulfonamides status: Secondary | ICD-10-CM | POA: Diagnosis not present

## 2022-12-24 DIAGNOSIS — E785 Hyperlipidemia, unspecified: Secondary | ICD-10-CM | POA: Diagnosis not present

## 2022-12-24 DIAGNOSIS — F32A Depression, unspecified: Secondary | ICD-10-CM | POA: Diagnosis present

## 2022-12-24 DIAGNOSIS — Z87891 Personal history of nicotine dependence: Secondary | ICD-10-CM | POA: Diagnosis not present

## 2022-12-24 DIAGNOSIS — N132 Hydronephrosis with renal and ureteral calculous obstruction: Secondary | ICD-10-CM | POA: Diagnosis not present

## 2022-12-24 DIAGNOSIS — K219 Gastro-esophageal reflux disease without esophagitis: Secondary | ICD-10-CM | POA: Diagnosis present

## 2022-12-24 DIAGNOSIS — G629 Polyneuropathy, unspecified: Secondary | ICD-10-CM | POA: Diagnosis present

## 2022-12-24 DIAGNOSIS — Z91030 Bee allergy status: Secondary | ICD-10-CM

## 2022-12-24 DIAGNOSIS — N201 Calculus of ureter: Principal | ICD-10-CM | POA: Diagnosis present

## 2022-12-24 DIAGNOSIS — I2510 Atherosclerotic heart disease of native coronary artery without angina pectoris: Secondary | ICD-10-CM | POA: Diagnosis present

## 2022-12-24 DIAGNOSIS — K589 Irritable bowel syndrome without diarrhea: Secondary | ICD-10-CM | POA: Diagnosis present

## 2022-12-24 DIAGNOSIS — F419 Anxiety disorder, unspecified: Secondary | ICD-10-CM | POA: Diagnosis present

## 2022-12-24 DIAGNOSIS — Q600 Renal agenesis, unilateral: Secondary | ICD-10-CM | POA: Diagnosis not present

## 2022-12-24 LAB — CBC
HCT: 40.5 % (ref 36.0–46.0)
Hemoglobin: 14 g/dL (ref 12.0–15.0)
MCH: 28.4 pg (ref 26.0–34.0)
MCHC: 34.6 g/dL (ref 30.0–36.0)
MCV: 82.2 fL (ref 80.0–100.0)
Platelets: 218 10*3/uL (ref 150–400)
RBC: 4.93 MIL/uL (ref 3.87–5.11)
RDW: 12.1 % (ref 11.5–15.5)
WBC: 8.6 10*3/uL (ref 4.0–10.5)
nRBC: 0 % (ref 0.0–0.2)

## 2022-12-24 LAB — COMPREHENSIVE METABOLIC PANEL
ALT: 19 U/L (ref 0–44)
AST: 18 U/L (ref 15–41)
Albumin: 4.3 g/dL (ref 3.5–5.0)
Alkaline Phosphatase: 71 U/L (ref 38–126)
Anion gap: 10 (ref 5–15)
BUN: 20 mg/dL (ref 6–20)
CO2: 23 mmol/L (ref 22–32)
Calcium: 9.4 mg/dL (ref 8.9–10.3)
Chloride: 107 mmol/L (ref 98–111)
Creatinine, Ser: 2.11 mg/dL — ABNORMAL HIGH (ref 0.44–1.00)
GFR, Estimated: 27 mL/min — ABNORMAL LOW (ref >60)
Glucose, Bld: 133 mg/dL — ABNORMAL HIGH (ref 70–99)
Potassium: 4.2 mmol/L (ref 3.5–5.1)
Sodium: 140 mmol/L (ref 135–145)
Total Bilirubin: 1.7 mg/dL — ABNORMAL HIGH (ref 0.3–1.2)
Total Protein: 7 g/dL (ref 6.5–8.1)

## 2022-12-24 LAB — LIPASE, BLOOD: Lipase: 48 U/L (ref 11–51)

## 2022-12-24 MED ORDER — SODIUM CHLORIDE 0.9 % IV BOLUS
1000.0000 mL | Freq: Once | INTRAVENOUS | Status: AC
  Administered 2022-12-24: 1000 mL via INTRAVENOUS

## 2022-12-24 MED ORDER — HYDROMORPHONE HCL 1 MG/ML IJ SOLN
1.0000 mg | Freq: Once | INTRAMUSCULAR | Status: AC
  Administered 2022-12-24: 1 mg via INTRAVENOUS
  Filled 2022-12-24: qty 1

## 2022-12-24 MED ORDER — HYDROMORPHONE HCL 1 MG/ML IJ SOLN
1.0000 mg | Freq: Once | INTRAMUSCULAR | Status: AC
  Administered 2022-12-25: 1 mg via INTRAVENOUS
  Filled 2022-12-24: qty 1

## 2022-12-24 MED ORDER — ONDANSETRON HCL 4 MG/2ML IJ SOLN
4.0000 mg | Freq: Once | INTRAMUSCULAR | Status: AC
  Administered 2022-12-24: 4 mg via INTRAVENOUS
  Filled 2022-12-24: qty 2

## 2022-12-24 NOTE — ED Triage Notes (Signed)
Patient here POV from Home.  Endorses Lower ABD Pain that radiates to Right Flank that began approximately 5 Hours ago.  Some N/V. No Diarrhea. No Fevers.   NAD Noted during Triage. A&Ox4. GCS 15. Ambulatory.

## 2022-12-24 NOTE — ED Provider Notes (Signed)
[facility] Provider Note   CSN: [id number] Arrival date & time: 12/24/22  1936     History  Chief Complaint  Patient presents with   Abdominal Pain    Sylvia [name] is a 57 y.o. female with history significant for hypertension, hyperlipidemia, left donor nephrectomy, reflux presents to the ED complaining of lower abdominal pain that radiates to her right flank.  She states the pain began approximately 5 hours ago and has been severe and worsening.  She has had associated nausea and vomiting, but no diarrhea.  She cannot recall when she last urinated and when she attempted to give a urine sample in the ED, only a few drops of blood came out.  Denies constipation, fever, chills.         Home Medications Prior to Admission medications   Medication Sig Start Date End Date Taking? Authorizing Provider  atenolol (TENORMIN) 25 MG tablet Take 1 tablet (25 mg total) by mouth every evening. 10/12/22 10/07/23  Adrian Prows, MD  botulinum toxin Type A (BOTOX) 100 units SOLR injection INJECT 155 UNITS INTO THE MUSCLES OF THE HEAD AND NECK EVERY 3 MONTHS. TO BE ADMINISTERED BY PROVIDER. DISCARD UNUSED PORTION. 06/11/20   Melvenia Beam, MD  Botulinum Toxin Type A 200 units SOLR Inject as directed Takes injections every 3 months    [provider]  LORazepam (ATIVAN) 0.5 MG tablet Take 0.5-1 mg by mouth 2 (two) times daily as needed for anxiety.    [provider]  losartan (COZAAR) 50 MG tablet Take 1 tablet (50 mg total) by mouth every evening. 08/24/22 08/19/23  Ernst Spell, NP  nitroGLYCERIN (NITROSTAT) 0.4 MG SL tablet Place 1 tablet (0.4 mg total) under the tongue every 5 (five) minutes as needed for up to 25 days for chest pain. 08/03/22 10/12/22  Adrian Prows, MD  Rimegepant Sulfate (NURTEC) 75 MG TBDP Take 75 mg by mouth daily as needed. For migraines. Take as close to onset of migraine as possible. One daily maximum.  10/23/19   Melvenia Beam, MD  rosuvastatin (CRESTOR) 10 MG tablet Take 10 mg by mouth daily.    [provider]  sertraline (ZOLOFT) 25 MG tablet Take 100 mg by mouth daily.    [provider]  [facility] 625 MG tablet Take 625 mg by mouth daily with breakfast. 08/27/22   [provider]      Allergies    Bee venom, Iodine, Nsaids, and Sulfa antibiotics    Review of Systems   Review of Systems  Constitutional:  Negative for chills and fever.  Gastrointestinal:  Positive for abdominal pain, nausea and vomiting. Negative for constipation and diarrhea.  Genitourinary:  Positive for decreased urine volume, difficulty urinating, flank pain and hematuria.    Physical Exam Updated Vital Signs BP 131/86   Pulse 97   Temp 97.9 F (36.6 C) (Oral)   Resp (!) 25   Ht 5\' 1"  (1.549 m)   Wt 80.7 kg   SpO2 99%   BMI 33.62 kg/m  Physical Exam Vitals and nursing note reviewed.  Constitutional:      General: She is not in acute distress.    Appearance: She is ill-appearing. She is not toxic-appearing or diaphoretic.  HENT:     Mouth/Throat:     Mouth: Mucous membranes are moist.     Pharynx: Oropharynx is clear.  Cardiovascular:     Rate and Rhythm: Normal rate and regular rhythm.  Pulses: Normal pulses.     Heart sounds: Normal heart sounds.  Pulmonary:     Effort: Pulmonary effort is normal. No respiratory distress.     Breath sounds: Normal breath sounds and air entry.  Abdominal:     General: Abdomen is flat. Bowel sounds are normal. There is no distension.     Palpations: Abdomen is soft.     Tenderness: There is abdominal tenderness in the right lower quadrant and suprapubic area. There is right CVA tenderness.  Skin:    General: Skin is warm and dry.     Capillary Refill: Capillary refill takes less than 2 seconds.  Neurological:     Mental Status: She is alert. Mental status is at baseline.  Psychiatric:        Mood and Affect: Mood normal.         Behavior: Behavior normal.     ED Results / Procedures / Treatments   Labs (all labs ordered are listed, but only abnormal results are displayed) Labs Reviewed  COMPREHENSIVE METABOLIC PANEL - Abnormal; Notable for the following components:      Result Value   Glucose, Bld 133 (*)    Creatinine, Ser 2.11 (*)    Total Bilirubin 1.7 (*)    GFR, Estimated 27 (*)    All other components within normal limits  LIPASE, BLOOD  CBC  URINALYSIS, ROUTINE W REFLEX MICROSCOPIC    EKG None  Radiology CT Renal Stone Study  Result Date: 12/24/2022 CLINICAL DATA:  Lower abdominal pain radiating to right flank. Nausea and vomiting. EXAM: CT ABDOMEN AND PELVIS WITHOUT CONTRAST TECHNIQUE: Multidetector CT imaging of the abdomen and pelvis was performed following the standard protocol without IV contrast. RADIATION DOSE REDUCTION: This exam was performed according to the departmental dose-optimization program which includes automated exposure control, adjustment of the mA and/or kV according to patient size and/or use of iterative reconstruction technique. COMPARISON:  None Available. FINDINGS: Lower chest: No acute abnormality. Hepatobiliary: There is a coarse calcification in the posterior right lobe of the liver. No biliary ductal dilatation. The gallbladder is surgically absent. Pancreas: Unremarkable. No pancreatic ductal dilatation or surrounding inflammatory changes. Spleen: Normal in size without focal abnormality. Adrenals/Urinary Tract: The adrenal glands are within normal limits. The left kidney is surgically absent. No renal calculus on the right. There is mild obstructive uropathy on the right with a 5 mm calculus in the distal right ureter. Perinephric and periureteral fat stranding are noted on the right. The bladder is decompressed. Stomach/Bowel: There is a small hiatal hernia. Stomach is otherwise within normal limits. Appendix is surgically absent. No evidence of bowel wall thickening,  distention, or inflammatory changes. No free air or pneumatosis. Scattered diverticula are present along the colon without evidence of diverticulitis. Vascular/Lymphatic: Aortic atherosclerosis. No enlarged abdominal or pelvic lymph nodes. Reproductive: Uterus and bilateral adnexa are unremarkable. Other: No abdominopelvic ascites. Musculoskeletal: Degenerative changes are present in the thoracolumbar spine. No acute or suspicious osseous abnormality. IMPRESSION: 1. Mild obstructive uropathy on the right with a 5 mm calculus in the distal right ureter at the UVJ. 2. Status post left nephrectomy. 3. Diverticulosis without diverticulitis. 4. Small hiatal hernia. 5. Aortic atherosclerosis. Electronically Signed   By: Brett Fairy M.D.   On: 12/24/2022 21:08    Procedures Procedures    Medications Ordered in ED Medications  sodium chloride 0.9 % bolus 1,000 mL (0 mLs Intravenous Stopped 12/24/22 2152)  ondansetron (ZOFRAN) injection 4 mg (4 mg Intravenous Given  12/24/22 2049)  HYDROmorphone (DILAUDID) injection 1 mg (1 mg Intravenous Given 12/24/22 2049)  sodium chloride 0.9 % bolus 1,000 mL (0 mLs Intravenous Stopped 12/25/22 0018)  HYDROmorphone (DILAUDID) injection 1 mg (1 mg Intravenous Given 12/25/22 0009)    ED Course/ Medical Decision Making/ A&P                             Medical Decision Making Amount and/or Complexity of Data Reviewed Labs: ordered. Radiology: ordered.  Risk Prescription drug management.   This patient presents to the ED with chief complaint(s) of lower abdominal pain that radiates to the right flank with associated nausea, vomiting, and difficulty urinating with pertinent past medical history of hypertension, hyperlipidemia, left donor nephrectomy .The complaint involves an extensive differential diagnosis and also carries with it a high risk of complications and morbidity.    The differential diagnosis includes acute cystitis, pyelonephritis, nephrolithiasis,  ureterolithiasis   The initial plan is to obtain baseline labs, UA  Additional history obtained: Additional history obtained from  none Records reviewed  none  Initial Assessment:   Exam significant for a patient who appears to be in pain and cannot sit still in the bed, she is rocking back and forth.  She is ill-appearing.  Skin is warm and dry.  Heart rate is normal in the 60s with regular rhythm.  Lungs clear to auscultation bilaterally.  Abdomen is tender in RLQ and suprapubic regions.  Bowel sounds normal, no distension.  All other quadrants non-tender.  R CVA tenderness present.    Independent ECG/labs interpretation:  The following labs were independently interpreted:  CBC - no leukocytosis or anemia Metabolic panel - evidence of AKI with Cr of 2.11, BUN is normal, GFR estimated to be 27; no major electrolyte disturbance  Lipase normal  UA - patient unable to produce urine, bladder scans show no urine in the bladder  Independent visualization and interpretation of imaging: I independently visualized the following imaging with scope of interpretation limited to determining acute life threatening conditions related to emergency care: CT renal stone study, which revealed obstructing stone in the R distal ureter  Treatment and Reassessment: Patient was given Zofran, Dilaudid, and fluid boluses in ED.  Her pain improved, however, she was still unable to produce any urine.  Multiple bladder scans revealed no urine in the bladder.    Consultations obtained:   Given that patient has been unable to produce urine, has evidence of AKI, and only has one kidney, I requested consultation with on-call urologist and spoke with Dr. Junious Silk who recommended having patient transferred to [facility] for emergent stent placement.    I requested consultation with on-call hospitalist and spoke with   Disposition:   Patient to be transferred to [facility] for emergent management of  obstructing calculi.           Final Clinical Impression(s) / ED Diagnoses Final diagnoses:  H/O left nephrectomy  Calculus of ureterovesical junction (UVJ)    Rx / DC Orders ED Discharge Orders     None         Pat Kocher, PA 12/25/22 0028    Malvin Johns, MD 12/25/22 [id number]

## 2022-12-24 NOTE — ED Provider Notes (Incomplete)
[facility] Provider Note   CSN: [id number] Arrival date & time: 12/24/22  1936     History {Add pertinent medical, surgical, social history, OB history to HPI:1} Chief Complaint  Patient presents with  . Abdominal Pain    Sylvia [name] is a 57 y.o. female with history significant for hypertension, hyperlipidemia, left donor nephrectomy, reflux presents to the ED complaining of lower abdominal pain that radiates to her right flank.  She states the pain began approximately 5 hours ago and has been severe and worsening.  She has had associated nausea and vomiting, but no diarrhea.  She cannot recall when she last urinated and when she attempted to give a urine sample in the ED, only a few drops of blood came out.  Denies constipation, fever, chills.         Home Medications Prior to Admission medications   Medication Sig Start Date End Date Taking? Authorizing Provider  atenolol (TENORMIN) 25 MG tablet Take 1 tablet (25 mg total) by mouth every evening. 10/12/22 10/07/23  Adrian Prows, MD  botulinum toxin Type A (BOTOX) 100 units SOLR injection INJECT 155 UNITS INTO THE MUSCLES OF THE HEAD AND NECK EVERY 3 MONTHS. TO BE ADMINISTERED BY PROVIDER. DISCARD UNUSED PORTION. 06/11/20   Melvenia Beam, MD  Botulinum Toxin Type A 200 units SOLR Inject as directed Takes injections every 3 months    [provider]  LORazepam (ATIVAN) 0.5 MG tablet Take 0.5-1 mg by mouth 2 (two) times daily as needed for anxiety.    [provider]  losartan (COZAAR) 50 MG tablet Take 1 tablet (50 mg total) by mouth every evening. 08/24/22 08/19/23  Ernst Spell, NP  nitroGLYCERIN (NITROSTAT) 0.4 MG SL tablet Place 1 tablet (0.4 mg total) under the tongue every 5 (five) minutes as needed for up to 25 days for chest pain. 08/03/22 10/12/22  Adrian Prows, MD  Rimegepant Sulfate (NURTEC) 75 MG TBDP Take 75 mg by mouth daily as needed. For migraines.  Take as close to onset of migraine as possible. One daily maximum. 10/23/19   Melvenia Beam, MD  rosuvastatin (CRESTOR) 10 MG tablet Take 10 mg by mouth daily.    [provider]  sertraline (ZOLOFT) 25 MG tablet Take 100 mg by mouth daily.    [provider]  [facility] 625 MG tablet Take 625 mg by mouth daily with breakfast. 08/27/22   [provider]      Allergies    Bee venom, Iodine, Nsaids, and Sulfa antibiotics    Review of Systems   Review of Systems  Constitutional:  Negative for chills and fever.  Gastrointestinal:  Positive for abdominal pain, nausea and vomiting. Negative for constipation and diarrhea.  Genitourinary:  Positive for decreased urine volume, difficulty urinating, flank pain and hematuria.    Physical Exam Updated Vital Signs BP (!) 154/97   Pulse 69   Temp 97.7 F (36.5 C) (Oral)   Resp 17   Ht 5\' 1"  (1.549 m)   Wt 80.7 kg   SpO2 96%   BMI 33.62 kg/m  Physical Exam Vitals and nursing note reviewed.  Constitutional:      General: She is not in acute distress.    Appearance: She is ill-appearing. She is not toxic-appearing or diaphoretic.  HENT:     Mouth/Throat:     Mouth: Mucous membranes are moist.     Pharynx: Oropharynx is clear.  Cardiovascular:  Rate and Rhythm: Normal rate and regular rhythm.     Pulses: Normal pulses.     Heart sounds: Normal heart sounds.  Pulmonary:     Effort: Pulmonary effort is normal. No respiratory distress.     Breath sounds: Normal breath sounds and air entry.  Abdominal:     General: Abdomen is flat. Bowel sounds are normal. There is no distension.     Palpations: Abdomen is soft.     Tenderness: There is abdominal tenderness in the right lower quadrant and suprapubic area. There is right CVA tenderness.  Skin:    General: Skin is warm and dry.     Capillary Refill: Capillary refill takes less than 2 seconds.  Neurological:     Mental Status: She is alert. Mental status is at  baseline.  Psychiatric:        Mood and Affect: Mood normal.        Behavior: Behavior normal.     ED Results / Procedures / Treatments   Labs (all labs ordered are listed, but only abnormal results are displayed) Labs Reviewed  COMPREHENSIVE METABOLIC PANEL - Abnormal; Notable for the following components:      Result Value   Glucose, Bld 133 (*)    Creatinine, Ser 2.11 (*)    Total Bilirubin 1.7 (*)    GFR, Estimated 27 (*)    All other components within normal limits  LIPASE, BLOOD  CBC  URINALYSIS, ROUTINE W REFLEX MICROSCOPIC    EKG None  Radiology CT Renal Stone Study  Result Date: 12/24/2022 CLINICAL DATA:  Lower abdominal pain radiating to right flank. Nausea and vomiting. EXAM: CT ABDOMEN AND PELVIS WITHOUT CONTRAST TECHNIQUE: Multidetector CT imaging of the abdomen and pelvis was performed following the standard protocol without IV contrast. RADIATION DOSE REDUCTION: This exam was performed according to the departmental dose-optimization program which includes automated exposure control, adjustment of the mA and/or kV according to patient size and/or use of iterative reconstruction technique. COMPARISON:  None Available. FINDINGS: Lower chest: No acute abnormality. Hepatobiliary: There is a coarse calcification in the posterior right lobe of the liver. No biliary ductal dilatation. The gallbladder is surgically absent. Pancreas: Unremarkable. No pancreatic ductal dilatation or surrounding inflammatory changes. Spleen: Normal in size without focal abnormality. Adrenals/Urinary Tract: The adrenal glands are within normal limits. The left kidney is surgically absent. No renal calculus on the right. There is mild obstructive uropathy on the right with a 5 mm calculus in the distal right ureter. Perinephric and periureteral fat stranding are noted on the right. The bladder is decompressed. Stomach/Bowel: There is a small hiatal hernia. Stomach is otherwise within normal limits.  Appendix is surgically absent. No evidence of bowel wall thickening, distention, or inflammatory changes. No free air or pneumatosis. Scattered diverticula are present along the colon without evidence of diverticulitis. Vascular/Lymphatic: Aortic atherosclerosis. No enlarged abdominal or pelvic lymph nodes. Reproductive: Uterus and bilateral adnexa are unremarkable. Other: No abdominopelvic ascites. Musculoskeletal: Degenerative changes are present in the thoracolumbar spine. No acute or suspicious osseous abnormality. IMPRESSION: 1. Mild obstructive uropathy on the right with a 5 mm calculus in the distal right ureter at the UVJ. 2. Status post left nephrectomy. 3. Diverticulosis without diverticulitis. 4. Small hiatal hernia. 5. Aortic atherosclerosis. Electronically Signed   By: Brett Fairy M.D.   On: 12/24/2022 21:08    Procedures Procedures  {Document cardiac monitor, telemetry assessment procedure when appropriate:1}  Medications Ordered in ED Medications  sodium chloride 0.9 %  bolus 1,000 mL (1,000 mLs Intravenous New Bag/Given 12/24/22 2051)  ondansetron [facility]) injection 4 mg (4 mg Intravenous Given 12/24/22 2049)  HYDROmorphone (DILAUDID) injection 1 mg (1 mg Intravenous Given 12/24/22 2049)    ED Course/ Medical Decision Making/ A&P   {   Click here for ABCD2, HEART and other calculatorsREFRESH Note before signing :1}                          Medical Decision Making Amount and/or Complexity of Data Reviewed Labs: ordered. Radiology: ordered.  Risk Prescription drug management.   This patient presents to the ED with chief complaint(s) of lower abdominal pain that radiates to the right flank with associated nausea, vomiting, and difficulty urinating with pertinent past medical history of hypertension, hyperlipidemia, left donor nephrectomy .The complaint involves an extensive differential diagnosis and also carries with it a high risk of complications and morbidity.    The  differential diagnosis includes acute cystitis, pyelonephritis, nephrolithiasis, ureterolithiasis   The initial plan is to obtain baseline labs, UA  Additional history obtained: Additional history obtained from  none Records reviewed  none  Initial Assessment:   Exam significant for a patient who appears to be in pain and cannot sit still in the bed, she is rocking back and forth.  She is ill-appearing.  Skin is warm and dry.  Heart rate is normal in the 60s with regular rhythm.  Lungs clear to auscultation bilaterally.  Abdomen is tender in RLQ and suprapubic regions.  Bowel sounds normal, no distension.  All other quadrants non-tender.  R CVA tenderness present.    Independent ECG/labs interpretation:  The following labs were independently interpreted:  CBC - no leukocytosis or anemia Metabolic panel - evidence of AKI with Cr of 2.11, BUN is normal, GFR estimated to be 27; no major electrolyte disturbance  Lipase normal  UA - patient unable to produce urine, bladder scans show no urine in the bladder  Independent visualization and interpretation of imaging: I independently visualized the following imaging with scope of interpretation limited to determining acute life threatening conditions related to emergency care: CT renal stone study, which revealed obstructing stone in the R distal ureter  Treatment and Reassessment: Patient was given Zofran, Dilaudid, and fluid boluses in ED.  Her pain improved, however, she was still unable to produce any urine.  Multiple bladder scans revealed no urine in the bladder.    Consultations obtained:   Given that patient has been unable to produce urine, has evidence of AKI, and only has one kidney, I requested consultation with on-call urologist and spoke with Dr. Junious Silk who recommended having patient transferred to [facility] for emergent stent placement.    I requested consultation with on-call hospitalist and spoke with   Disposition:    Patient to be transferred to [facility] for emergent management of obstructing calculi.     {Document critical care time when appropriate:1} {Document review of labs and clinical decision tools ie heart score, Chads2Vasc2 etc:1}  {Document your independent review of radiology images, and any outside records:1} {Document your discussion with family members, caretakers, and with consultants:1} {Document social determinants of health affecting pt's care:1} {Document your decision making why or why not admission, treatments were needed:1} Final Clinical Impression(s) / ED Diagnoses Final diagnoses:  None    Rx / DC Orders ED Discharge Orders     None

## 2022-12-24 NOTE — ED Notes (Signed)
Bladder scan shows no measurable volume.

## 2022-12-25 ENCOUNTER — Observation Stay (HOSPITAL_COMMUNITY): Payer: BC Managed Care – PPO | Admitting: Certified Registered"

## 2022-12-25 ENCOUNTER — Encounter (HOSPITAL_COMMUNITY)
Admission: EM | Disposition: A | Discharge status: Home / Self Care | Payer: Self-pay | Source: Home / Self Care | Attending: Family Medicine

## 2022-12-25 ENCOUNTER — Observation Stay (HOSPITAL_COMMUNITY): Payer: BC Managed Care – PPO

## 2022-12-25 ENCOUNTER — Encounter (HOSPITAL_COMMUNITY): Payer: Self-pay | Admitting: Internal Medicine

## 2022-12-25 DIAGNOSIS — F32A Depression, unspecified: Secondary | ICD-10-CM | POA: Diagnosis not present

## 2022-12-25 DIAGNOSIS — N179 Acute kidney failure, unspecified: Secondary | ICD-10-CM | POA: Diagnosis not present

## 2022-12-25 DIAGNOSIS — K449 Diaphragmatic hernia without obstruction or gangrene: Secondary | ICD-10-CM | POA: Diagnosis not present

## 2022-12-25 DIAGNOSIS — Z8249 Family history of ischemic heart disease and other diseases of the circulatory system: Secondary | ICD-10-CM | POA: Diagnosis not present

## 2022-12-25 DIAGNOSIS — F419 Anxiety disorder, unspecified: Secondary | ICD-10-CM | POA: Diagnosis not present

## 2022-12-25 DIAGNOSIS — N132 Hydronephrosis with renal and ureteral calculous obstruction: Secondary | ICD-10-CM | POA: Diagnosis not present

## 2022-12-25 DIAGNOSIS — I2510 Atherosclerotic heart disease of native coronary artery without angina pectoris: Secondary | ICD-10-CM | POA: Diagnosis not present

## 2022-12-25 DIAGNOSIS — Z981 Arthrodesis status: Secondary | ICD-10-CM | POA: Diagnosis not present

## 2022-12-25 DIAGNOSIS — Z905 Acquired absence of kidney: Secondary | ICD-10-CM | POA: Diagnosis not present

## 2022-12-25 DIAGNOSIS — K219 Gastro-esophageal reflux disease without esophagitis: Secondary | ICD-10-CM | POA: Diagnosis not present

## 2022-12-25 DIAGNOSIS — Z79899 Other long term (current) drug therapy: Secondary | ICD-10-CM | POA: Diagnosis not present

## 2022-12-25 DIAGNOSIS — Z87820 Personal history of traumatic brain injury: Secondary | ICD-10-CM | POA: Diagnosis not present

## 2022-12-25 DIAGNOSIS — Z882 Allergy status to sulfonamides status: Secondary | ICD-10-CM | POA: Diagnosis not present

## 2022-12-25 DIAGNOSIS — Z87891 Personal history of nicotine dependence: Secondary | ICD-10-CM | POA: Diagnosis not present

## 2022-12-25 DIAGNOSIS — I10 Essential (primary) hypertension: Secondary | ICD-10-CM | POA: Diagnosis not present

## 2022-12-25 DIAGNOSIS — K589 Irritable bowel syndrome without diarrhea: Secondary | ICD-10-CM | POA: Diagnosis not present

## 2022-12-25 DIAGNOSIS — F4310 Post-traumatic stress disorder, unspecified: Secondary | ICD-10-CM | POA: Diagnosis not present

## 2022-12-25 DIAGNOSIS — N201 Calculus of ureter: Secondary | ICD-10-CM

## 2022-12-25 DIAGNOSIS — E785 Hyperlipidemia, unspecified: Secondary | ICD-10-CM | POA: Insufficient documentation

## 2022-12-25 DIAGNOSIS — Z9049 Acquired absence of other specified parts of digestive tract: Secondary | ICD-10-CM | POA: Diagnosis not present

## 2022-12-25 DIAGNOSIS — Z888 Allergy status to other drugs, medicaments and biological substances status: Secondary | ICD-10-CM | POA: Diagnosis not present

## 2022-12-25 DIAGNOSIS — G4733 Obstructive sleep apnea (adult) (pediatric): Secondary | ICD-10-CM | POA: Diagnosis not present

## 2022-12-25 DIAGNOSIS — Z91030 Bee allergy status: Secondary | ICD-10-CM | POA: Diagnosis not present

## 2022-12-25 DIAGNOSIS — Z8261 Family history of arthritis: Secondary | ICD-10-CM | POA: Diagnosis not present

## 2022-12-25 DIAGNOSIS — I77810 Thoracic aortic ectasia: Secondary | ICD-10-CM | POA: Diagnosis not present

## 2022-12-25 HISTORY — PX: CYSTOSCOPY/RETROGRADE/URETEROSCOPY: SHX5316

## 2022-12-25 LAB — BASIC METABOLIC PANEL
Anion gap: 9 (ref 5–15)
BUN: 19 mg/dL (ref 6–20)
CO2: 19 mmol/L — ABNORMAL LOW (ref 22–32)
Calcium: 8.3 mg/dL — ABNORMAL LOW (ref 8.9–10.3)
Chloride: 111 mmol/L (ref 98–111)
Creatinine, Ser: 2.34 mg/dL — ABNORMAL HIGH (ref 0.44–1.00)
GFR, Estimated: 24 mL/min — ABNORMAL LOW (ref >60)
Glucose, Bld: 136 mg/dL — ABNORMAL HIGH (ref 70–99)
Potassium: 4.4 mmol/L (ref 3.5–5.1)
Sodium: 139 mmol/L (ref 135–145)

## 2022-12-25 LAB — CBC
HCT: 36.8 % (ref 36.0–46.0)
Hemoglobin: 12 g/dL (ref 12.0–15.0)
MCH: 27.8 pg (ref 26.0–34.0)
MCHC: 32.6 g/dL (ref 30.0–36.0)
MCV: 85.4 fL (ref 80.0–100.0)
Platelets: 175 10*3/uL (ref 150–400)
RBC: 4.31 MIL/uL (ref 3.87–5.11)
RDW: 12 % (ref 11.5–15.5)
WBC: 6 10*3/uL (ref 4.0–10.5)
nRBC: 0 % (ref 0.0–0.2)

## 2022-12-25 LAB — HIV ANTIBODY (ROUTINE TESTING W REFLEX): HIV Screen 4th Generation wRfx: NONREACTIVE

## 2022-12-25 LAB — URINALYSIS, ROUTINE W REFLEX MICROSCOPIC
Bilirubin Urine: NEGATIVE
Glucose, UA: NEGATIVE mg/dL
Ketones, ur: NEGATIVE mg/dL
Leukocytes,Ua: NEGATIVE
Nitrite: NEGATIVE
Protein, ur: 30 mg/dL — AB
RBC / HPF: >50 RBC/hpf (ref 0–5)
Specific Gravity, Urine: 1.008 (ref 1.005–1.030)
pH: 5 (ref 5.0–8.0)

## 2022-12-25 SURGERY — CYSTOSCOPY/RETROGRADE/URETEROSCOPY
Anesthesia: General | Site: Ureter | Laterality: Right

## 2022-12-25 MED ORDER — LIDOCAINE 2% (20 MG/ML) 5 ML SYRINGE
INTRAMUSCULAR | Status: DC | PRN
  Administered 2022-12-25: 100 mg via INTRAVENOUS

## 2022-12-25 MED ORDER — SODIUM CHLORIDE 0.9 % IV SOLN: INTRAVENOUS | Status: AC

## 2022-12-25 MED ORDER — OXYCODONE HCL 5 MG/5ML PO SOLN: 5.0000 mg | Freq: Once | ORAL | Status: DC | PRN

## 2022-12-25 MED ORDER — LACTATED RINGERS IV SOLN: INTRAVENOUS | Status: DC | PRN

## 2022-12-25 MED ORDER — PHENYLEPHRINE 80 MCG/ML (10ML) SYRINGE FOR IV PUSH (FOR BLOOD PRESSURE SUPPORT)
PREFILLED_SYRINGE | INTRAVENOUS | Status: AC
  Filled 2022-12-25: qty 10

## 2022-12-25 MED ORDER — HYDROMORPHONE HCL 1 MG/ML IJ SOLN
1.0000 mg | INTRAMUSCULAR | Status: DC | PRN
  Administered 2022-12-25: 1 mg via INTRAVENOUS
  Filled 2022-12-25: qty 1

## 2022-12-25 MED ORDER — ONDANSETRON HCL 4 MG/2ML IJ SOLN
INTRAMUSCULAR | Status: DC | PRN
  Administered 2022-12-25: 4 mg via INTRAVENOUS

## 2022-12-25 MED ORDER — CHLORHEXIDINE GLUCONATE CLOTH 2 % EX PADS
6.0000 | MEDICATED_PAD | Freq: Every day | CUTANEOUS | Status: DC
  Administered 2022-12-25: 6 via TOPICAL

## 2022-12-25 MED ORDER — DEXAMETHASONE SODIUM PHOSPHATE 10 MG/ML IJ SOLN
INTRAMUSCULAR | Status: AC
  Filled 2022-12-25: qty 1

## 2022-12-25 MED ORDER — ACETAMINOPHEN 160 MG/5ML PO SOLN: 325.0000 mg | ORAL | Status: DC | PRN

## 2022-12-25 MED ORDER — ACETAMINOPHEN 325 MG PO TABS: 325.0000 mg | ORAL_TABLET | ORAL | Status: DC | PRN

## 2022-12-25 MED ORDER — ROCURONIUM BROMIDE 10 MG/ML (PF) SYRINGE
PREFILLED_SYRINGE | INTRAVENOUS | Status: AC
  Filled 2022-12-25: qty 10

## 2022-12-25 MED ORDER — FENTANYL CITRATE PF 50 MCG/ML IJ SOSY: 25.0000 ug | PREFILLED_SYRINGE | INTRAMUSCULAR | Status: DC | PRN

## 2022-12-25 MED ORDER — CEFAZOLIN SODIUM-DEXTROSE 2-4 GM/100ML-% IV SOLN
2.0000 g | Freq: Once | INTRAVENOUS | Status: AC
  Administered 2022-12-25: 2 g via INTRAVENOUS

## 2022-12-25 MED ORDER — LORAZEPAM 0.5 MG PO TABS
0.5000 mg | ORAL_TABLET | Freq: Two times a day (BID) | ORAL | Status: DC | PRN
  Administered 2022-12-25: 0.5 mg via ORAL
  Filled 2022-12-25: qty 1

## 2022-12-25 MED ORDER — ONDANSETRON HCL 4 MG/2ML IJ SOLN
4.0000 mg | Freq: Once | INTRAMUSCULAR | Status: AC
  Administered 2022-12-25: 4 mg via INTRAVENOUS
  Filled 2022-12-25: qty 2

## 2022-12-25 MED ORDER — PROCHLORPERAZINE EDISYLATE 10 MG/2ML IJ SOLN
INTRAMUSCULAR | Status: AC
  Administered 2022-12-25: 10 mg via INTRAVENOUS
  Filled 2022-12-25: qty 2

## 2022-12-25 MED ORDER — PROCHLORPERAZINE EDISYLATE 10 MG/2ML IJ SOLN: 10.0000 mg | Freq: Once | INTRAMUSCULAR | Status: AC

## 2022-12-25 MED ORDER — MIDAZOLAM HCL 2 MG/2ML IJ SOLN
INTRAMUSCULAR | Status: DC | PRN
  Administered 2022-12-25 (×2): 1 mg via INTRAVENOUS

## 2022-12-25 MED ORDER — PHENYLEPHRINE HCL (PRESSORS) 10 MG/ML IV SOLN
INTRAVENOUS | Status: AC
  Filled 2022-12-25: qty 1

## 2022-12-25 MED ORDER — CEFAZOLIN SODIUM-DEXTROSE 2-4 GM/100ML-% IV SOLN
INTRAVENOUS | Status: AC
  Filled 2022-12-25: qty 100

## 2022-12-25 MED ORDER — MIDAZOLAM HCL 2 MG/2ML IJ SOLN
INTRAMUSCULAR | Status: AC
  Filled 2022-12-25: qty 2

## 2022-12-25 MED ORDER — ONDANSETRON HCL 4 MG/2ML IJ SOLN
INTRAMUSCULAR | Status: AC
  Filled 2022-12-25: qty 2

## 2022-12-25 MED ORDER — FENTANYL CITRATE (PF) 100 MCG/2ML IJ SOLN
INTRAMUSCULAR | Status: AC
  Filled 2022-12-25: qty 2

## 2022-12-25 MED ORDER — ROSUVASTATIN CALCIUM 10 MG PO TABS
10.0000 mg | ORAL_TABLET | Freq: Every day | ORAL | Status: DC
  Administered 2022-12-25: 10 mg via ORAL
  Filled 2022-12-25: qty 1

## 2022-12-25 MED ORDER — DEXAMETHASONE SODIUM PHOSPHATE 10 MG/ML IJ SOLN
INTRAMUSCULAR | Status: DC | PRN
  Administered 2022-12-25: 10 mg via INTRAVENOUS

## 2022-12-25 MED ORDER — ONDANSETRON HCL 4 MG/2ML IJ SOLN: 4.0000 mg | Freq: Once | INTRAMUSCULAR | Status: DC | PRN

## 2022-12-25 MED ORDER — MEPERIDINE HCL 50 MG/ML IJ SOLN: 6.2500 mg | INTRAMUSCULAR | Status: DC | PRN

## 2022-12-25 MED ORDER — 0.9 % SODIUM CHLORIDE (POUR BTL) OPTIME
TOPICAL | Status: DC | PRN
  Administered 2022-12-25: 1000 mL

## 2022-12-25 MED ORDER — PROPOFOL 10 MG/ML IV BOLUS
INTRAVENOUS | Status: AC
  Filled 2022-12-25: qty 20

## 2022-12-25 MED ORDER — OXYCODONE HCL 5 MG PO TABS: 5.0000 mg | ORAL_TABLET | Freq: Once | ORAL | Status: DC | PRN

## 2022-12-25 MED ORDER — IOHEXOL 300 MG/ML  SOLN
INTRAMUSCULAR | Status: DC | PRN
  Administered 2022-12-25: 2 mL via URETHRAL

## 2022-12-25 MED ORDER — PROPOFOL 10 MG/ML IV BOLUS
INTRAVENOUS | Status: DC | PRN
  Administered 2022-12-25: 120 mg via INTRAVENOUS

## 2022-12-25 MED ORDER — ATENOLOL 25 MG PO TABS
25.0000 mg | ORAL_TABLET | Freq: Every day | ORAL | Status: DC
  Administered 2022-12-25: 25 mg via ORAL
  Filled 2022-12-25: qty 1

## 2022-12-25 MED ORDER — LIDOCAINE HCL (PF) 2 % IJ SOLN
INTRAMUSCULAR | Status: AC
  Filled 2022-12-25: qty 10

## 2022-12-25 MED ORDER — SODIUM CHLORIDE 0.9 % IR SOLN
Status: DC | PRN
  Administered 2022-12-25: 3000 mL

## 2022-12-25 MED ORDER — FENTANYL CITRATE (PF) 100 MCG/2ML IJ SOLN
INTRAMUSCULAR | Status: DC | PRN
  Administered 2022-12-25 (×2): 50 ug via INTRAVENOUS

## 2022-12-25 MED ORDER — SUCCINYLCHOLINE CHLORIDE 200 MG/10ML IV SOSY
PREFILLED_SYRINGE | INTRAVENOUS | Status: AC
  Filled 2022-12-25: qty 10

## 2022-12-25 MED ORDER — PHENYLEPHRINE HCL-NACL 20-0.9 MG/250ML-% IV SOLN
INTRAVENOUS | Status: DC | PRN
  Administered 2022-12-25: 25 ug/min via INTRAVENOUS

## 2022-12-25 SURGICAL SUPPLY — 28 items
BAG URO CATCHER STRL LF (MISCELLANEOUS) ×1 IMPLANT
BASKET LASER NITINOL 1.9FR (BASKET) IMPLANT
BASKET ZERO TIP NITINOL 2.4FR (BASKET) IMPLANT
CATH URET 5FR 28IN CONE TIP (BALLOONS)
CATH URET 5FR 70CM CONE TIP (BALLOONS) IMPLANT
CATH URETL OPEN 5X70 (CATHETERS) IMPLANT
CATH URETL OPEN END 6FR 70 (CATHETERS) ×1 IMPLANT
CLOTH BEACON ORANGE TIMEOUT ST (SAFETY) ×1 IMPLANT
GLOVE BIO SURGEON STRL SZ7.5 (GLOVE) ×1 IMPLANT
GLOVE BIO SURGEON STRL SZ8 (GLOVE) IMPLANT
GLOVE BIOGEL PI IND STRL 8 (GLOVE) IMPLANT
GOWN STRL REUS W/ TWL XL LVL3 (GOWN DISPOSABLE) ×1 IMPLANT
GOWN STRL REUS W/TWL XL LVL3 (GOWN DISPOSABLE) ×2
GUIDEWIRE STR DUAL SENSOR (WIRE) ×1 IMPLANT
GUIDEWIRE ZIPWRE .038 STRAIGHT (WIRE) IMPLANT
HIBICLENS CHG 4% 4OZ BTL (MISCELLANEOUS) IMPLANT
KIT TURNOVER KIT A (KITS) IMPLANT
LASER FIB FLEXIVA PULSE ID 365 (Laser) IMPLANT
MANIFOLD NEPTUNE II (INSTRUMENTS) ×1 IMPLANT
PACK CYSTO (CUSTOM PROCEDURE TRAY) ×1 IMPLANT
SHEATH NAVIGATOR HD 11/13X28 (SHEATH) IMPLANT
SHEATH NAVIGATOR HD 11/13X36 (SHEATH) IMPLANT
STENT URET 6FRX24 CONTOUR (STENTS) IMPLANT
TRACTIP FLEXIVA PULS ID 200XHI (Laser) IMPLANT
TRACTIP FLEXIVA PULSE ID 200 (Laser) IMPLANT
TRAY FOLEY MTR SLVR 16FR STAT (SET/KITS/TRAYS/PACK) IMPLANT
TUBING CONNECTING 10 (TUBING) ×1 IMPLANT
TUBING UROLOGY SET (TUBING) ×1 IMPLANT

## 2022-12-25 NOTE — Anesthesia Procedure Notes (Signed)
Procedure Name: LMA Insertion Date/Time: 12/25/2022 4:28 AM  Performed by: Cynda Familia, CRNAPre-anesthesia Checklist: Patient identified, Emergency Drugs available, Suction available and Patient being monitored Patient Re-evaluated:Patient Re-evaluated prior to induction Oxygen Delivery Method: Circle System Utilized Preoxygenation: Pre-oxygenation with 100% oxygen Induction Type: IV induction Ventilation: Mask ventilation without difficulty LMA: LMA inserted and LMA with gastric port inserted LMA Size: 4.0 Number of attempts: 1 Placement Confirmation: positive ETCO2 and breath sounds checked- equal and bilateral Tube secured with: Tape Dental Injury: Teeth and Oropharynx as per pre-operative assessment  Comments: Smooth IV induction Oddono-- LMA insertion AM CRNA atraumatic-- bilat BS

## 2022-12-25 NOTE — Op Note (Signed)
Preoperative diagnosis: Right distal stone, solitary right kidney, right hydronephrosis and acute renal failure Postoperative diagnosis: Same  Procedure: Cystoscopy with right retrograde pyelogram,  Surgeon: Junious Silk  Anesthesia: General  Indication for procedure: Sylvia [name] is a 57 year old female who donated the left kidney.  She had right flank pain and stopped urinating.  CT scan revealed a 5 mm right distal stone.  She was brought urgently to OR.  Findings:  Cystoscopy was unremarkable with a normal bladder and urethra.  It should be noted the bladder was empty.  Right retrograde pyelogram-this outlined a filling defect in the right distal ureter consistent with the stone and proximal hydroureteronephrosis.  Ureteroscopy-hard stone located at the right ureterovesical junction.  Wire visualized in the lumen up into the mid ureter.  Drainage was bloody initially and a small clot noted in the mid ureter that was pulled out with the basket.  Description of procedure: After consent was obtained patient brought to the operating room.  After adequate anesthesia she was placed in lithotomy position and prepped and draped in the usual sterile fashion.  Timeout was performed to confirm the patient and procedure.  The cystoscope was passed per urethra and the bladder inspected.  5 [place] open-ended catheter used to cannulate the right ureteral orifice.  Retrograde injection of contrast was performed.  A sensor wire was advanced and there was some resistance but with some steady pressure it passed and coiled in the upper calyx.  Bloody drainage ensued.  5 [place] open-ended catheter backed out and a single channel semirigid scope was advanced.  The stone was located at the UVJ.  Thought it was just too big to try to remove intact.  A 262 m laser fiber was advanced and the stone was fragmented into 3 pieces.  They were removed sequentially with a 0 tip basket and dropped in the bladder.  Reinspection noted  there to be no other stone fragments.  Open the mid ureter I did not note a clot and it was then snared with the basket and removed and dropped in the bladder.  Repeat ureteroscopy revealed there to be no other clots and no other stone fragments.  I was well up into the mid ureter and can see proximally and otherwise ureter unremarkable.  On the way out again no stone fragment and no injury noted.  Cystoscope replaced and the fragments drained.  Wire was backloaded on the cystoscope and a 6 x 24 cm stent advanced.  Wire was removed with a good coil seen in the right renal pelvis and a good coil in the bladder.  I placed a 57 French Foley to drain her bladder over the next 8 hours to help with some of the diuresis.  There was quite a bit and inflammation and given she has solitary kidney I thought it best not to leave the string.  She was awakened taken the cover room in stable condition.  Complications: None  Blood loss: Minimal  Specimens: Stone fragments to patient  Drains: 6 x 24 cm right ureteral stent  Disposition: Patient stable to PACU

## 2022-12-25 NOTE — Progress Notes (Signed)
  Transition of Care [facility]) Screening Note   Patient Details  Name: Sylvia [name] Date of Birth: June 04, 1966   Transition of Care [facility]) CM/SW Contact:    Lennart Pall, LCSW Phone Number: 12/25/2022, 11:22 AM    Transition of Care Department [facility]) has reviewed patient and no TOC needs have been identified at this time. We will continue to monitor patient advancement through interdisciplinary progression rounds. If new patient transition needs arise, please place a TOC consult.

## 2022-12-25 NOTE — Transfer of Care (Signed)
Immediate Anesthesia Transfer of Care Note  Patient: Sylvia [name]  Procedure(s) Performed: CYSTOSCOPY WITH RIGHT URETEROSCOPY, LASER LITHROTRIPSY AND RIGHT STENT PLACEMENT (Right: Ureter)  Patient Location: PACU  Anesthesia Type:General  Level of Consciousness: awake  Airway & Oxygen Therapy: Patient Spontanous Breathing and Patient connected to face mask oxygen  Post-op Assessment: Report given to RN and Post -op Vital signs reviewed and stable  Post vital signs: Reviewed and stable  Last Vitals:  Vitals Value Taken Time  BP 156/133 12/25/22 0515  Temp    Pulse 103 12/25/22 0516  Resp 23 12/25/22 0516  SpO2 95 % 12/25/22 0516  Vitals shown include unvalidated device data.  Last Pain:  Vitals:   12/25/22 0148  TempSrc:   PainSc: 2          Complications: No notable events documented.

## 2022-12-25 NOTE — ED Notes (Signed)
Pt complains of N. Meds ordered. See MAR.

## 2022-12-25 NOTE — Progress Notes (Signed)
   Patient seen and examined at bedside, patient admitted after midnight, please see earlier detailed admission note by Malvin Johns, MD. Briefly, patient presented secondary to abdominal pain and was found to have an obstructing ureteral stone with associated AKI. Urology consulted and performed cystoscopy with ureteral stent placement.  Subjective: Patient feels better than on admission. No issues this morning.  BP 110/64 (BP Location: Right Arm)   Pulse 76   Temp 98 F (36.7 C)   Resp 19   Ht 5\' 1"  (1.549 m)   Wt 81.5 kg   SpO2 95%   BMI 33.95 kg/m   General exam: Appears calm and comfortable Respiratory system: Clear to auscultation. Respiratory effort normal. Cardiovascular system: S1 & S2 heard, RRR. No murmurs, rubs, gallops or clicks. Gastrointestinal system: Abdomen is nondistended, soft and nontender. No organomegaly or masses felt. Normal bowel sounds heard. Central nervous system: Alert and oriented. No focal neurological deficits. Musculoskeletal: No edema. No calf tenderness Skin: No cyanosis. No rashes Psychiatry: Judgement and insight appear normal. Mood & affect appropriate.   Brief assessment/Plan:  Obstructing ureteral stone AKI Complicated by solitary kidney. S/p cystoscopy and ureteral stent placement by urology. Creatinine of 2.11 on admission and is up to 2.34 today. -Continue IV fluids -Repeat BMP in AM  Ascending aortic dilation Primary hypertension Hyperlipidemia Per H&P   Family communication: None at bedside DVT prophylaxis: SCDs Disposition: Discharge home in 24 hours if creatinine stable/trending down  Cordelia Poche, MD [facility] 12/25/2022, 2:51 PM

## 2022-12-25 NOTE — Discharge Instructions (Signed)
Be sure to follow-up with Dr. Jahvon Gosline for stent removal  

## 2022-12-25 NOTE — Plan of Care (Signed)
   Patient Name: Sylvia [name], Sylvia [name] DOB: [birth year] VVZ:[id number] Transferring facility: [facility] Requesting provider:  Carlis Abbott, PA Reason for transfer: obstructing right ureteral stone 57 yo with acquired solitary kidney with right flank pain. CT urogram shows obstructing 5 mm right ureteral stone. EDP discussed with urology([facility]). pt will need cysto and JJ stent placement tonight. pt is NPO. Going to: [facility] Admission Status: observation Bed Type: med/surg To Do: contact urology([facility]) on pt arrival to [facility] will assume care on arrival to accepting facility. Until arrival, medical decision making responsibilities remain with the EDP.  However, [facility] available 24/7 for questions and assistance.   Nursing staff please page [place] and Consults [phone]) as soon as the patient arrives to the hospital.  Kristopher Oppenheim, DO [facility]

## 2022-12-25 NOTE — Progress Notes (Signed)
[facility] Admits & Consults paged Dr. Marlowe Sax for new admit patient

## 2022-12-25 NOTE — Plan of Care (Signed)

## 2022-12-25 NOTE — Anesthesia Procedure Notes (Signed)
Date/Time: 12/25/2022 5:08 AM  Performed by: Cynda Familia, CRNAOxygen Delivery Method: Simple face mask Placement Confirmation: positive ETCO2 and breath sounds checked- equal and bilateral Dental Injury: Teeth and Oropharynx as per pre-operative assessment

## 2022-12-25 NOTE — Plan of Care (Signed)
  Problem: Education: Goal: Knowledge of General Education information will improve Description: Including pain rating scale, medication(s)/side effects and non-pharmacologic comfort measures Outcome: Progressing   Problem: Health Behavior/Discharge Planning: Goal: Ability to manage health-related needs will improve Outcome: Progressing   Problem: Clinical Measurements: Goal: Ability to maintain clinical measurements within normal limits will improve Outcome: Progressing   Problem: Activity: Goal: Risk for activity intolerance will decrease Outcome: Progressing   Problem: Nutrition: Goal: Adequate nutrition will be maintained Outcome: Progressing   Problem: Elimination: Goal: Will not experience complications related to bowel motility Outcome: Progressing   Problem: Pain Managment: Goal: General experience of comfort will improve Outcome: Progressing   Problem: Safety: Goal: Ability to remain free from injury will improve Outcome: Progressing   

## 2022-12-25 NOTE — Progress Notes (Signed)
S: She is well. No complaints. Wants foley out!  O:  Vitals:   12/25/22 0600 12/25/22 0621  BP: 105/66 116/72  Pulse: 83 86  Resp: 16 20  Temp: 98.6 F (37 C) 97.8 F (36.6 C)  SpO2: 93% 93%   Alert and O Urine clear/pink in tubing  A/P:  -right distal stone, ARF s/p Right ureteroscopy, laser and stent -   -d/c foley -home when medically stable - today OK if BMP looks good this afternoon -f/u outpt for cysto/stent removal - discussed with patient   Really appreciate excellent [facility] care.

## 2022-12-25 NOTE — Anesthesia Preprocedure Evaluation (Addendum)
Anesthesia Evaluation  Patient identified by MRN, date of birth, ID band Patient awake    Reviewed: Allergy & Precautions, H&P , NPO status , Patient's Chart, lab work & pertinent test results  Airway Mallampati: II  TM Distance: >3 FB Neck ROM: Full    Dental no notable dental hx. (+) Teeth Intact, Dental Advisory Given, Caps   Pulmonary sleep apnea , former smoker   Pulmonary exam normal breath sounds clear to auscultation       Cardiovascular Exercise Tolerance: Good hypertension, Pt. on medications and Pt. on home beta blockers + CAD  Normal cardiovascular exam Rhythm:Regular Rate:Normal  Echocardiogram 03/17/2021: Normal LV systolic function with visual EF 60-65%. Left ventricle cavity is normal in size. Normal global wall motion. Normal diastolic filling pattern, normal LAP. Mild (Grade I) aortic regurgitation. Mild (Grade I) mitral regurgitation. Mild to moderate tricuspid regurgitation. No evidence of pulmonary hypertension. Aortic root normal size, proximal ascending aorta dilated. (Sinotubular junction 30mm, proximal ascending aorta 38-82mm). Prior study dated 12/12/2019: LVEF 55%, moderate AR, moderate MR, moderate TR, proximal ascending aorta 4.2 cm.     Neuro/Psych  Headaches PSYCHIATRIC DISORDERS Anxiety Depression     Neuromuscular disease negative neurological ROS  negative psych ROS   GI/Hepatic Neg liver ROS, hiatal hernia,,,  Endo/Other  negative endocrine ROS    Renal/GU ARFRenal disease  negative genitourinary   Musculoskeletal negative musculoskeletal ROS (+)    Abdominal   Peds negative pediatric ROS (+)  Hematology negative hematology ROS (+)   Anesthesia Other Findings   Reproductive/Obstetrics negative OB ROS                             Anesthesia Physical Anesthesia Plan  ASA: 3 and emergent  Anesthesia Plan: General   Post-op Pain Management:     Induction: Intravenous  PONV Risk Score and Plan: 3 and Ondansetron, Dexamethasone and Treatment may vary due to age or medical condition  Airway Management Planned: Oral ETT and LMA  Additional Equipment: None  Intra-op Plan:   Post-operative Plan: Extubation in OR  Informed Consent: I have reviewed the patients History and Physical, chart, labs and discussed the procedure including the risks, benefits and alternatives for the proposed anesthesia with the patient or authorized representative who has indicated his/her understanding and acceptance.       Plan Discussed with: Anesthesiologist and CRNA  Anesthesia Plan Comments: ( History of Present Illness: Sylvia [name] is a 57 year old female with a solitary right kidney.  She donated her left kidney in 2014.  She began to have right flank pain and presented to emergency.  She has not made urine for hours.  CT scan of the abdomen and pelvis was obtained which showed a 5 mm right distal stone and right hydroureteronephrosis.  No significant renal stones.  Creatinine was up to 2.11 and white count 8.6.  She has had no fever or chills.  No UA as she has not made enough urine.  )       Anesthesia Quick Evaluation
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
## 2022-12-26 DIAGNOSIS — N201 Calculus of ureter: Secondary | ICD-10-CM | POA: Diagnosis not present

## 2022-12-26 LAB — BASIC METABOLIC PANEL
Anion gap: 3 — ABNORMAL LOW (ref 5–15)
BUN: 12 mg/dL (ref 6–20)
CO2: 25 mmol/L (ref 22–32)
Calcium: 8.4 mg/dL — ABNORMAL LOW (ref 8.9–10.3)
Chloride: 113 mmol/L — ABNORMAL HIGH (ref 98–111)
Creatinine, Ser: 1.06 mg/dL — ABNORMAL HIGH (ref 0.44–1.00)
GFR, Estimated: >60 mL/min (ref >60)
Glucose, Bld: 121 mg/dL — ABNORMAL HIGH (ref 70–99)
Potassium: 4.3 mmol/L (ref 3.5–5.1)
Sodium: 141 mmol/L (ref 135–145)

## 2022-12-26 NOTE — Plan of Care (Signed)

## 2022-12-26 NOTE — Progress Notes (Signed)
Urology Progress Note   1 Day Post-Op from ureteroscopic stone removal in solitary kidney (right)  Subjective: NAEON. Discussed stent and expected time course. She would like to leave for [place] for a baby shower Friday, and so I will reach out to the clinic to try to get her appointment moved up. Creatinine normalized  Objective: Vital signs in last 24 hours: Temp:  [98 F (36.7 C)-98.1 F (36.7 C)] 98.1 F (36.7 C) (02/03 0410) Pulse Rate:  [70-82] 70 (02/03 0410) Resp:  [16-19] 18 (02/03 0410) BP: (107-116)/(64-74) 116/74 (02/03 0410) SpO2:  [94 %-97 %] 97 % (02/03 0410)  Intake/Output from previous day: 02/02 0701 - 02/03 0700 In: 1720.2 [P.O.:718; I.V.:1002.2] Out: 800 [Urine:800] Intake/Output this shift: Total I/O In: 160 [P.O.:160] Out: -   Physical Exam:  General: Alert and oriented CV: Regular rate Lungs: No increased work of breathing Abdomen:  Soft, appropriately tender. Incisions c/d/i. JP SS GU: voiding spontaneously   Ext: NT, No erythema  Lab Results: Recent Labs    12/24/22 1954 12/25/22 0645  HGB 14.0 12.0  HCT 40.5 36.8   Recent Labs    12/25/22 0645 12/26/22 0349  NA 139 141  K 4.4 4.3  CL 111 113*  CO2 19* 25  GLUCOSE 136* 121*  BUN 19 12  CREATININE 2.34* 1.06*  CALCIUM 8.3* 8.4*    Studies/Results: DG C-Arm 1-60 Min-No Report  Result Date: 12/25/2022 Fluoroscopy was utilized by the requesting physician.  No radiographic interpretation.   CT Renal Stone Study  Result Date: 12/24/2022 CLINICAL DATA:  Lower abdominal pain radiating to right flank. Nausea and vomiting. EXAM: CT ABDOMEN AND PELVIS WITHOUT CONTRAST TECHNIQUE: Multidetector CT imaging of the abdomen and pelvis was performed following the standard protocol without IV contrast. RADIATION DOSE REDUCTION: This exam was performed according to the departmental dose-optimization program which includes automated exposure control, adjustment of the mA and/or kV according to  patient size and/or use of iterative reconstruction technique. COMPARISON:  None Available. FINDINGS: Lower chest: No acute abnormality. Hepatobiliary: There is a coarse calcification in the posterior right lobe of the liver. No biliary ductal dilatation. The gallbladder is surgically absent. Pancreas: Unremarkable. No pancreatic ductal dilatation or surrounding inflammatory changes. Spleen: Normal in size without focal abnormality. Adrenals/Urinary Tract: The adrenal glands are within normal limits. The left kidney is surgically absent. No renal calculus on the right. There is mild obstructive uropathy on the right with a 5 mm calculus in the distal right ureter. Perinephric and periureteral fat stranding are noted on the right. The bladder is decompressed. Stomach/Bowel: There is a small hiatal hernia. Stomach is otherwise within normal limits. Appendix is surgically absent. No evidence of bowel wall thickening, distention, or inflammatory changes. No free air or pneumatosis. Scattered diverticula are present along the colon without evidence of diverticulitis. Vascular/Lymphatic: Aortic atherosclerosis. No enlarged abdominal or pelvic lymph nodes. Reproductive: Uterus and bilateral adnexa are unremarkable. Other: No abdominopelvic ascites. Musculoskeletal: Degenerative changes are present in the thoracolumbar spine. No acute or suspicious osseous abnormality. IMPRESSION: 1. Mild obstructive uropathy on the right with a 5 mm calculus in the distal right ureter at the UVJ. 2. Status post left nephrectomy. 3. Diverticulosis without diverticulitis. 4. Small hiatal hernia. 5. Aortic atherosclerosis. Electronically Signed   By: Brett Fairy M.D.   On: 12/24/2022 21:08    Assessment/Plan:  57 y.o. female s/p ureteroscopic stone extraction.  Overall doing well post-op.   - ok for home - will message clinic  re: stent removal appt  Dispo: floor   LOS: 1 day

## 2022-12-26 NOTE — TOC Initial Note (Signed)
Transition of Care [facility]) - Initial/Assessment Note    Patient Details  Name: Sylvia [name] MRN: [id number] Date of Birth: 1966/05/26  Transition of Care [facility] CM/SW Contact:    Henrietta Dine, RN Phone Number: 12/26/2022, 11:26 AM  Clinical Narrative:                 SDOH needs noted; spoke w/ pt in room; pt says she is from home and she plans to return at d/c; pt identified her POC Seeley Southgate (spouse) [phone]; pt says she has transportation; she denies IPV, food insecurities, or problems paying utilities; pt says she has glasses and bilateral HA; she does not have dentures, DME. [place] services, or home oxygen; no TOC needs; TOC will sign off.  Expected Discharge Plan: Home/Self Care Barriers to Discharge: Continued Medical Work up   Patient Goals and CMS Choice Patient states their goals for this hospitalization and ongoing recovery are:: home          Expected Discharge Plan and Services   Discharge Planning Services: CM Consult Post Acute Care Choice: NA Living arrangements for the past 2 months: Single Family Home                                      Prior Living Arrangements/Services Living arrangements for the past 2 months: Single Family Home Lives with:: Spouse Patient language and need for interpreter reviewed:: Yes Do you feel safe going back to the place where you live?: Yes      Need for Family Participation in Patient Care: Yes (Comment)   Current home services:  (n/a) Criminal Activity/Legal Involvement Pertinent to Current Situation/Hospitalization: No - Comment as needed  Activities of Daily Living Home Assistive Devices/Equipment: None ADL Screening (condition at time of admission) Patient's cognitive ability adequate to safely complete daily activities?: Yes Is the patient deaf or have difficulty hearing?: No Does the patient have difficulty seeing, even when wearing glasses/contacts?: No Does the patient have difficulty  concentrating, remembering, or making decisions?: No Patient able to express need for assistance with ADLs?: No Does the patient have difficulty dressing or bathing?: No Independently performs ADLs?: Yes (appropriate for developmental age) Does the patient have difficulty walking or climbing stairs?: Yes Weakness of Legs: None Weakness of Arms/Hands: None  Permission Sought/Granted Permission sought to share information with : Case Manager Permission granted to share information with : Yes, Verbal Permission Granted  Share Information with NAME: Lenor Coffin, RN, CM     Permission granted to share info w Relationship: Kristianne Albin (husband) [phone]     Emotional Assessment Appearance:: Appears stated age Attitude/Demeanor/Rapport: Gracious Affect (typically observed): Accepting Orientation: : Oriented to Self, Oriented to Place, Oriented to  Time, Oriented to Situation Alcohol / Substance Use: Not Applicable Psych Involvement: No (comment)  Admission diagnosis:  Ureterolithiasis [N20.1] H/O left nephrectomy [Z90.5] Calculus of ureterovesical junction (UVJ) [N20.1] Patient Active Problem List   Diagnosis Date Noted   Ureterolithiasis 12/25/2022   AKI (acute kidney injury) ([place]) 12/25/2022   HTN (hypertension) 12/25/2022   HLD (hyperlipidemia) 12/25/2022   Ascending Aortic dilatation (HCC) at 38-39 mm. 2021 08/24/2022   Vasospastic angina ([place]) 08/24/2022   Chronic migraine without aura, with intractable migraine, so stated, with status migrainosus 10/23/2019   Traumatic brain injury with loss of consciousness ([place]) 10/23/2019   Anxiety and depression 10/23/2019   Post concussion syndrome 10/23/2019  SINUSITIS, RECURRENT 10/09/2009   ADJ DISORDER WITH MIXED ANXIETY & DEPRESSED MOOD 06/11/2009   ADVERSE REACTION TO MEDICATION 05/21/2009   TMJ PAIN 04/25/2009   COLONIC POLYPS, HX OF 04/25/2009   TINNITUS 04/08/2009   UNSPECIFIED HEARING LOSS 04/08/2009   LOW BACK  PAIN, MILD 01/10/2009   GRIEF REACTION 12/31/2008   INFECTION, SKIN AND SOFT TISSUE 12/31/2008   MIGRAINE HEADACHE 09/04/2008   ASTHMA 09/01/2008   ATTENTION DEFICIT DISORDER, ADULT 08/08/2008   INSOMNIA-SLEEP DISORDER-UNSPEC 08/08/2008   IBS 08/15/2007   PCP:  Audley Hose, MD Pharmacy:   [facility] [place], [place] - [address] [place] [address] Phone: [phone] Fax: [phone]  [place], [place] [address] [place] [address] Phone: [phone] Fax: [place] [address] [place] - [address] [place] [address] Phone: [phone] Fax: [phone]     Social Determinants of Health (SDOH) Social History: [place]: Food Insecurity Present (12/25/2022)  Housing: Low Risk  (12/25/2022)  Transportation Needs: Unmet Transportation Needs (12/25/2022)  Utilities: Not At Risk (12/25/2022)  Tobacco Use: Medium Risk (12/25/2022)   SDOH Interventions:     Readmission Risk Interventions     No data to display

## 2022-12-26 NOTE — Progress Notes (Signed)
Pt discharging home with no needs. Pt is alert and oriented. Pt IV was discontinued. Pt AVS was given and explained. Pt was given time to ask questions. Belongings were packed and sent home with the patient. Pt friend Erline Levine at bedside to drive pt home. Pt escorted by RN via wheelchair to main entrance to discharge home.

## 2022-12-26 NOTE — Discharge Summary (Signed)
Physician Discharge Summary   Patient: Sylvia [name] MRN: [id number] DOB: February 15, 1966  Admit date:     12/24/2022  Discharge date: 12/26/22  Discharge Physician: Cordelia Poche, MD   PCP: Audley Hose, MD   Recommendations at discharge:  Follow-up with PCP and Urology  Discharge Diagnoses: Principal Problem:   Ureterolithiasis Active Problems:   AKI (acute kidney injury) ([place])   HTN (hypertension)   HLD (hyperlipidemia)  Resolved Problems:   * No resolved hospital problems. *  Hospital Course: Sylvia [name] is a 57 y.o. female with a history of hypertension, hyperlipidemia, hiatal hernia, IBS, TBI and headaches, solitary kidney s/p left donor nephrectomy, OSA, ascending aortic dilation. Patient presented secondary to abdominal pain and was found to have a right obstructing kidney stone requiring urology consult and stent placement. Associated AKI resolved after stent placement.  Assessment and Plan:  Obstructing right ureteral stone AKI Complicated by solitary kidney. S/p cystoscopy and ureteral stent placement by urology. Creatinine of 2.11 on admission with peak of 2.34. Improved to 1.06 prior to discharge. AKI resolved. Patient to follow-up with urology as an outpatient.  Primary hypertension Blood pressure low-normal. Continue atenolol but discontinue amlodipine and losartan; resume as able/needed.   Ascending aortic dilation Noted.  Hyperlipidemia Continue home regimen.   Consultants: Urology Procedures performed: Cystoscopy and right ureteral stent placement  Disposition: Home Diet recommendation: Cardiac diet   DISCHARGE MEDICATION: Allergies as of 12/26/2022       Reactions   Bee Venom Anaphylaxis   Yellow jackets- systemic reaction Hornets- local reaction   Iodine    Nauseous, cramping, vomiting, blackout    Nsaids    Cannot take nsaids- only has one kidney   Sulfa Antibiotics Rash   Tape Rash   Pt reports severe reaction to clear tape          Medication List     STOP taking these medications    amLODipine 5 MG tablet Commonly known as: NORVASC   losartan 50 MG tablet Commonly known as: COZAAR       TAKE these medications    atenolol 25 MG tablet Commonly known as: TENORMIN Take 1 tablet (25 mg total) by mouth every evening.   botulinum toxin Type A 200 units injection Commonly known as: BOTOX Inject as directed Takes injections every 3 months   Botox 100 units Solr injection Generic drug: botulinum toxin Type A INJECT 155 UNITS INTO THE MUSCLES OF THE HEAD AND NECK EVERY 3 MONTHS. TO BE ADMINISTERED BY PROVIDER. DISCARD UNUSED PORTION.   CITRACAL PO Take 1 tablet by mouth daily.   LORazepam 0.5 MG tablet Commonly known as: ATIVAN Take 0.5-1 mg by mouth 2 (two) times daily as needed for anxiety.   nitroGLYCERIN 0.4 MG SL tablet Commonly known as: NITROSTAT Place 1 tablet (0.4 mg total) under the tongue every 5 (five) minutes as needed for up to 25 days for chest pain.   Nurtec 75 MG Tbdp Generic drug: Rimegepant Sulfate Take 75 mg by mouth daily as needed. For migraines. Take as close to onset of migraine as possible. One daily maximum. What changed:  reasons to take this additional instructions   ondansetron 8 MG tablet Commonly known as: ZOFRAN Take 8 mg by mouth every 8 (eight) hours as needed for vomiting or nausea.   rosuvastatin 10 MG tablet Commonly known as: CRESTOR Take 10 mg by mouth daily.   Welchol 625 MG tablet Generic drug: colesevelam Take 625 mg by mouth daily  with breakfast.        Follow-up Information     [name], MD. Call.   Specialty: Urology Contact information: [place] [address] [phone]         Audley Hose, MD. Schedule an appointment as soon as possible for a visit in 1 week(s).   Specialty: Internal Medicine Why: For hospital follow-up Contact information: [place]  [address] [phone]                Discharge Exam: BP 116/74 (BP Location: Right Arm)   Pulse 70   Temp 98.1 F (36.7 C) (Oral)   Resp 18   Ht 5\' 1"  (1.549 m)   Wt 81.5 kg   SpO2 97%   BMI 33.95 kg/m   General exam: Appears calm and comfortable Respiratory system: Clear to auscultation. Respiratory effort normal. Cardiovascular system: S1 & S2 heard, RRR. Gastrointestinal system: Abdomen is nondistended, soft and nontender. Normal bowel sounds heard. Central nervous system: Alert and oriented. No focal neurological deficits. Musculoskeletal: No edema. No calf tenderness Skin: No cyanosis. No rashes Psychiatry: Judgement and insight appear normal. Mood & affect appropriate.   Condition at discharge: stable  The results of significant diagnostics from this hospitalization (including imaging, microbiology, ancillary and laboratory) are listed below for reference.   Imaging Studies: DG C-Arm 1-60 Min-No Report  Result Date: 12/25/2022 Fluoroscopy was utilized by the requesting physician.  No radiographic interpretation.   CT Renal Stone Study  Result Date: 12/24/2022 CLINICAL DATA:  Lower abdominal pain radiating to right flank. Nausea and vomiting. EXAM: CT ABDOMEN AND PELVIS WITHOUT CONTRAST TECHNIQUE: Multidetector CT imaging of the abdomen and pelvis was performed following the standard protocol without IV contrast. RADIATION DOSE REDUCTION: This exam was performed according to the departmental dose-optimization program which includes automated exposure control, adjustment of the mA and/or kV according to patient size and/or use of iterative reconstruction technique. COMPARISON:  None Available. FINDINGS: Lower chest: No acute abnormality. Hepatobiliary: There is a coarse calcification in the posterior right lobe of the liver. No biliary ductal dilatation. The gallbladder is surgically absent. Pancreas: Unremarkable. No pancreatic ductal dilatation or surrounding inflammatory  changes. Spleen: Normal in size without focal abnormality. Adrenals/Urinary Tract: The adrenal glands are within normal limits. The left kidney is surgically absent. No renal calculus on the right. There is mild obstructive uropathy on the right with a 5 mm calculus in the distal right ureter. Perinephric and periureteral fat stranding are noted on the right. The bladder is decompressed. Stomach/Bowel: There is a small hiatal hernia. Stomach is otherwise within normal limits. Appendix is surgically absent. No evidence of bowel wall thickening, distention, or inflammatory changes. No free air or pneumatosis. Scattered diverticula are present along the colon without evidence of diverticulitis. Vascular/Lymphatic: Aortic atherosclerosis. No enlarged abdominal or pelvic lymph nodes. Reproductive: Uterus and bilateral adnexa are unremarkable. Other: No abdominopelvic ascites. Musculoskeletal: Degenerative changes are present in the thoracolumbar spine. No acute or suspicious osseous abnormality. IMPRESSION: 1. Mild obstructive uropathy on the right with a 5 mm calculus in the distal right ureter at the UVJ. 2. Status post left nephrectomy. 3. Diverticulosis without diverticulitis. 4. Small hiatal hernia. 5. Aortic atherosclerosis. Electronically Signed   By: Brett Fairy M.D.   On: 12/24/2022 21:08    Microbiology: No results found for this or any previous visit.  Labs: CBC: Recent Labs  Lab 12/24/22 1954 12/25/22 0645  WBC 8.6 6.0  HGB 14.0 12.0  HCT 40.5 36.8  MCV 82.2 85.4  PLT 218 876   Basic Metabolic Panel: Recent Labs  Lab 12/24/22 1954 12/25/22 0645 12/26/22 0349  NA 140 139 141  K 4.2 4.4 4.3  CL 107 111 113*  CO2 23 19* 25  GLUCOSE 133* 136* 121*  BUN 20 19 12   CREATININE 2.11* 2.34* 1.06*  CALCIUM 9.4 8.3* 8.4*   Liver Function Tests: Recent Labs  Lab 12/24/22 1954  AST 18  ALT 19  ALKPHOS 71  BILITOT 1.7*  PROT 7.0  ALBUMIN 4.3   Discharge time spent: 35  minutes.  Signed: Cordelia Poche, MD [facility] 12/26/2022

## 2022-12-26 NOTE — Plan of Care (Signed)
  Problem: Education: Goal: Knowledge of General Education information will improve Description: Including pain rating scale, medication(s)/side effects and non-pharmacologic comfort measures 12/26/2022 0852 by Linus Salmons, RN Outcome: Progressing 12/26/2022 0851 by Linus Salmons, RN Outcome: Progressing   Problem: Health Behavior/Discharge Planning: Goal: Ability to manage health-related needs will improve 12/26/2022 0852 by Linus Salmons, RN Outcome: Progressing 12/26/2022 0851 by Linus Salmons, RN Outcome: Progressing   Problem: Clinical Measurements: Goal: Ability to maintain clinical measurements within normal limits will improve 12/26/2022 0852 by Linus Salmons, RN Outcome: Progressing 12/26/2022 0851 by Linus Salmons, RN Outcome: Progressing Goal: Will remain free from infection 12/26/2022 0852 by Linus Salmons, RN Outcome: Progressing 12/26/2022 0851 by Linus Salmons, RN Outcome: Progressing Goal: Diagnostic test results will improve 12/26/2022 0852 by Linus Salmons, RN Outcome: Progressing 12/26/2022 0851 by Linus Salmons, RN Outcome: Progressing Goal: Respiratory complications will improve 12/26/2022 0852 by Linus Salmons, RN Outcome: Progressing 12/26/2022 0851 by Linus Salmons, RN Outcome: Progressing Goal: Cardiovascular complication will be avoided 12/26/2022 6433 by Linus Salmons, RN Outcome: Progressing 12/26/2022 0851 by Linus Salmons, RN Outcome: Progressing   Problem: Activity: Goal: Risk for activity intolerance will decrease 12/26/2022 0852 by Linus Salmons, RN Outcome: Progressing 12/26/2022 0851 by Linus Salmons, RN Outcome: Progressing   Problem: Nutrition: Goal: Adequate nutrition will be maintained 12/26/2022 2951 by Linus Salmons, RN Outcome: Progressing 12/26/2022 0851 by Linus Salmons, RN Outcome: Progressing   Problem: Coping: Goal: Level of anxiety will decrease 12/26/2022 0852 by Linus Salmons, RN Outcome: Progressing 12/26/2022 0851 by Linus Salmons, RN Outcome: Progressing   Problem: Elimination: Goal: Will not experience complications related to bowel motility 12/26/2022 0852 by Linus Salmons, RN Outcome: Progressing 12/26/2022 0851 by Linus Salmons, RN Outcome: Progressing Goal: Will not experience complications related to urinary retention 12/26/2022 0852 by Linus Salmons, RN Outcome: Progressing 12/26/2022 0851 by Linus Salmons, RN Outcome: Progressing   Problem: Pain Managment: Goal: General experience of comfort will improve 12/26/2022 0852 by Linus Salmons, RN Outcome: Progressing 12/26/2022 0851 by Linus Salmons, RN Outcome: Progressing   Problem: Safety: Goal: Ability to remain free from injury will improve 12/26/2022 0852 by Linus Salmons, RN Outcome: Progressing 12/26/2022 0851 by Linus Salmons, RN Outcome: Progressing   Problem: Skin Integrity: Goal: Risk for impaired skin integrity will decrease 12/26/2022 0852 by Linus Salmons, RN Outcome: Progressing 12/26/2022 0851 by Linus Salmons, RN Outcome: Progressing

## 2022-12-26 NOTE — Hospital Course (Signed)
Sylvia [name] is a 57 y.o. female with a history of hypertension, hyperlipidemia, hiatal hernia, IBS, TBI and headaches, solitary kidney s/p left donor nephrectomy, OSA, ascending aortic dilation. Patient presented secondary to abdominal pain and was found to have a right obstructing kidney stone requiring urology consult and stent placement. Associated AKI resolved after stent placement.

## 2022-12-27 ENCOUNTER — Encounter (HOSPITAL_COMMUNITY): Payer: Self-pay | Admitting: Urology

## 2022-12-28 ENCOUNTER — Encounter: Payer: Self-pay | Admitting: Cardiology

## 2022-12-28 NOTE — Telephone Encounter (Signed)
From patient

## 2022-12-29 DIAGNOSIS — R000 Tachycardia, unspecified: Secondary | ICD-10-CM | POA: Diagnosis not present

## 2022-12-29 DIAGNOSIS — N3001 Acute cystitis with hematuria: Secondary | ICD-10-CM | POA: Diagnosis not present

## 2022-12-29 DIAGNOSIS — R0789 Other chest pain: Secondary | ICD-10-CM | POA: Diagnosis not present

## 2022-12-29 DIAGNOSIS — M542 Cervicalgia: Secondary | ICD-10-CM | POA: Diagnosis not present

## 2022-12-29 DIAGNOSIS — R5383 Other fatigue: Secondary | ICD-10-CM | POA: Diagnosis not present

## 2022-12-30 DIAGNOSIS — R8271 Bacteriuria: Secondary | ICD-10-CM | POA: Diagnosis not present

## 2022-12-30 DIAGNOSIS — N201 Calculus of ureter: Secondary | ICD-10-CM | POA: Diagnosis not present

## 2023-01-03 NOTE — Anesthesia Postprocedure Evaluation (Signed)
Anesthesia Post Note  Patient: Sylvia [name]  Procedure(s) Performed: CYSTOSCOPY WITH RIGHT URETEROSCOPY, LASER LITHROTRIPSY AND RIGHT STENT PLACEMENT (Right: Ureter)     Patient location during evaluation: PACU Anesthesia Type: General Level of consciousness: awake and alert Pain management: pain level controlled Vital Signs Assessment: post-procedure vital signs reviewed and stable Respiratory status: spontaneous breathing, nonlabored ventilation, respiratory function stable and patient connected to nasal cannula oxygen Cardiovascular status: blood pressure returned to baseline and stable Postop Assessment: no apparent nausea or vomiting Anesthetic complications: no  No notable events documented.  Last Vitals:  Vitals:   12/25/22 1957 12/26/22 0410  BP: 107/71 116/74  Pulse: 82 70  Resp: 16 18  Temp: 36.7 C 36.7 C  SpO2: 94% 97%    Last Pain:  Vitals:   12/26/22 0800  TempSrc:   PainSc: 0-No pain                 Oda Placke

## 2023-01-19 DIAGNOSIS — G43E19 Chronic migraine with aura, intractable, without status migrainosus: Secondary | ICD-10-CM | POA: Diagnosis not present

## 2023-01-19 DIAGNOSIS — Z461 Encounter for fitting and adjustment of hearing aid: Secondary | ICD-10-CM | POA: Diagnosis not present

## 2023-02-01 ENCOUNTER — Ambulatory Visit: Payer: BC Managed Care – PPO | Admitting: Cardiology

## 2023-02-22 ENCOUNTER — Ambulatory Visit: Payer: Medicare HMO | Admitting: Cardiology

## 2023-02-22 ENCOUNTER — Encounter: Payer: Self-pay | Admitting: Cardiology

## 2023-02-22 VITALS — BP 110/81 | HR 66 | Resp 16 | Ht 61.0 in | Wt 173.0 lb

## 2023-02-22 DIAGNOSIS — Z905 Acquired absence of kidney: Secondary | ICD-10-CM

## 2023-02-22 DIAGNOSIS — I77819 Aortic ectasia, unspecified site: Secondary | ICD-10-CM

## 2023-02-22 DIAGNOSIS — I201 Angina pectoris with documented spasm: Secondary | ICD-10-CM
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
## 2023-03-25 ENCOUNTER — Other Ambulatory Visit: Payer: Self-pay | Admitting: Internal Medicine

## 2023-03-25 ENCOUNTER — Encounter: Payer: Self-pay | Admitting: Internal Medicine

## 2023-03-25 DIAGNOSIS — Z1239 Encounter for other screening for malignant neoplasm of breast: Secondary | ICD-10-CM

## 2023-03-25 DIAGNOSIS — E041 Nontoxic single thyroid nodule: Secondary | ICD-10-CM

## 2023-03-29 ENCOUNTER — Ambulatory Visit
Admission: RE | Admit: 2023-03-29 | Discharge: 2023-03-29 | Disposition: A | Discharge status: Home / Self Care | Payer: 59 | Source: Ambulatory Visit | Attending: Internal Medicine | Admitting: Internal Medicine

## 2023-03-29 DIAGNOSIS — Z1239 Encounter for other screening for malignant neoplasm of breast: Secondary | ICD-10-CM

## 2023-04-05 ENCOUNTER — Ambulatory Visit: Payer: Medicare HMO

## 2023-04-05 DIAGNOSIS — I77819 Aortic ectasia, unspecified site: Secondary | ICD-10-CM

## 2023-04-08 ENCOUNTER — Ambulatory Visit: Payer: Medicare HMO | Admitting: Cardiology

## 2023-04-12 ENCOUNTER — Ambulatory Visit: Payer: Medicare HMO | Admitting: Cardiology

## 2023-04-12 ENCOUNTER — Encounter: Payer: Self-pay | Admitting: Cardiology

## 2023-04-12 VITALS — BP 127/81 | HR 90 | Resp 16 | Ht 61.0 in | Wt 173.6 lb

## 2023-04-12 DIAGNOSIS — I201 Angina pectoris with documented spasm: Secondary | ICD-10-CM

## 2023-04-12 DIAGNOSIS — I77819 Aortic ectasia, unspecified site: Secondary | ICD-10-CM

## 2023-04-12 DIAGNOSIS — E7800 Pure hypercholesterolemia, unspecified: Secondary | ICD-10-CM
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
## 2023-04-22 ENCOUNTER — Ambulatory Visit
Admission: RE | Admit: 2023-04-22 | Discharge: 2023-04-22 | Disposition: A | Discharge status: Home / Self Care | Payer: Medicare HMO | Source: Ambulatory Visit | Attending: Internal Medicine | Admitting: Internal Medicine

## 2023-04-22 DIAGNOSIS — E041 Nontoxic single thyroid nodule: Secondary | ICD-10-CM

## 2023-04-28 ENCOUNTER — Other Ambulatory Visit: Payer: Self-pay | Admitting: Cardiology

## 2023-07-27 ENCOUNTER — Other Ambulatory Visit: Payer: Self-pay | Admitting: Internal Medicine

## 2023-07-27 DIAGNOSIS — K760 Fatty (change of) liver, not elsewhere classified: Secondary | ICD-10-CM

## 2023-08-05 ENCOUNTER — Other Ambulatory Visit: Payer: 59

## 2023-08-05 ENCOUNTER — Ambulatory Visit
Admission: RE | Admit: 2023-08-05 | Discharge: 2023-08-05 | Disposition: A | Discharge status: Home / Self Care | Payer: 59 | Source: Ambulatory Visit | Attending: Internal Medicine | Admitting: Internal Medicine

## 2023-08-05 DIAGNOSIS — K760 Fatty (change of) liver, not elsewhere classified: Secondary | ICD-10-CM

## 2023-11-02 ENCOUNTER — Other Ambulatory Visit: Payer: Self-pay | Admitting: Cardiology

## 2023-11-02 DIAGNOSIS — R002 Palpitations: Secondary | ICD-10-CM

## 2023-11-03 ENCOUNTER — Encounter: Payer: Self-pay | Admitting: Cardiology

## 2023-11-05 MED ORDER — ISOSORBIDE MONONITRATE ER 60 MG PO TB24: 60.0000 mg | ORAL_TABLET | Freq: Every day | ORAL | 3 refills | Status: DC

## 2023-11-05 NOTE — Telephone Encounter (Signed)
Spoke with patient concerning chest pain (frequency 2-3x per week, lasts around 5-10 minutes, with some nausea, denies SOB, taking 2-3 nitroglycerin per episode with relief). Consulted with Dr Jacinto Halim, adding Imdur 60 mg once daily and follow up appointment scheduled on 12/27/23. Also instructed patient that Dr Jacinto Halim stated the atenolol could make chest pain worse, discontinue per provider. Patient thankful for the call, no further needs at this time

## 2023-12-07 IMAGING — CT CT CHEST W/O CM
1 of 2 series · 15 of 32 positions shown, 19 images · non-contrast
Comparison: 02/27/2020

CLINICAL DATA: Right lower lobe nodule



[Series 6: super d · axial · 0.78mm/px · z∈[-184,+61]mm · 15 of 344 slices shown, 19 images]
[im 19/344  mediastinal]
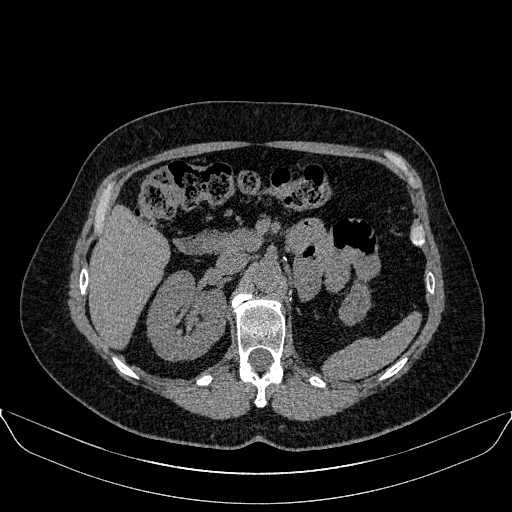
[im 19/344  lung]
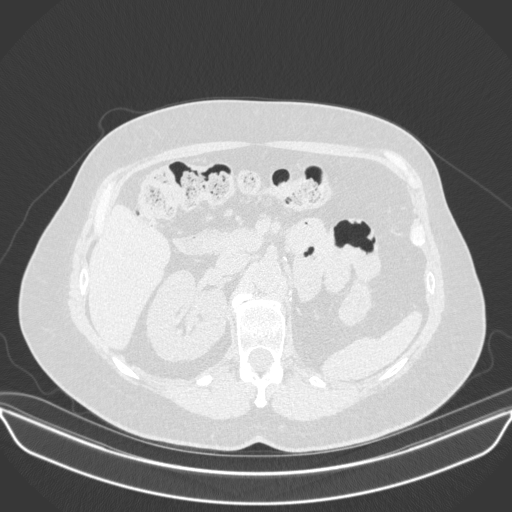
[im 55/344  lung]
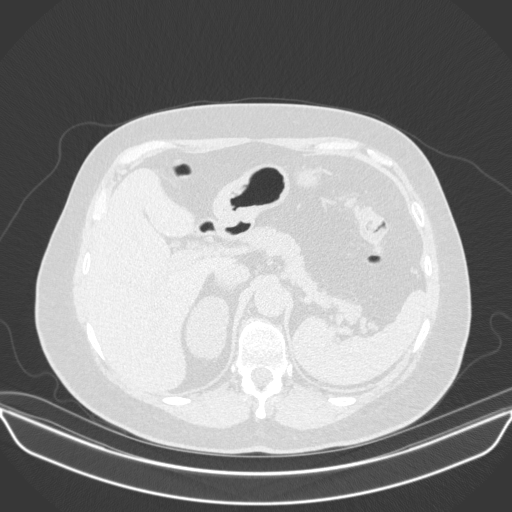
[im 73/344  lung]
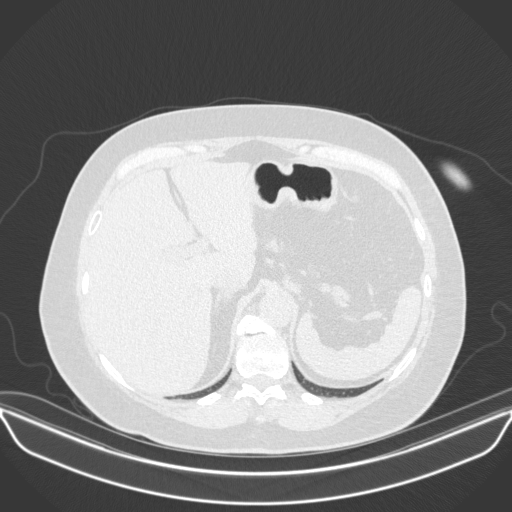
[im 91/344  lung]
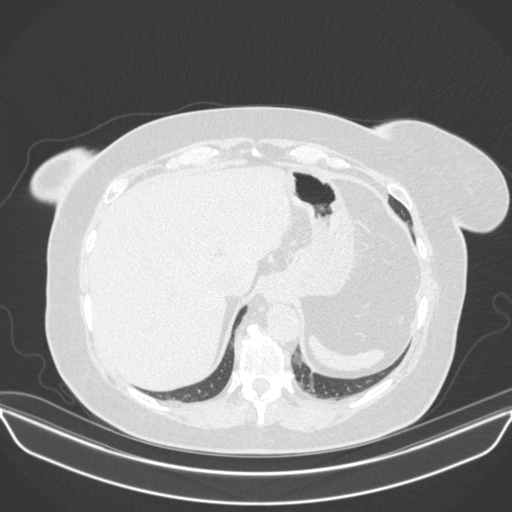
[im 115/344  mediastinal]
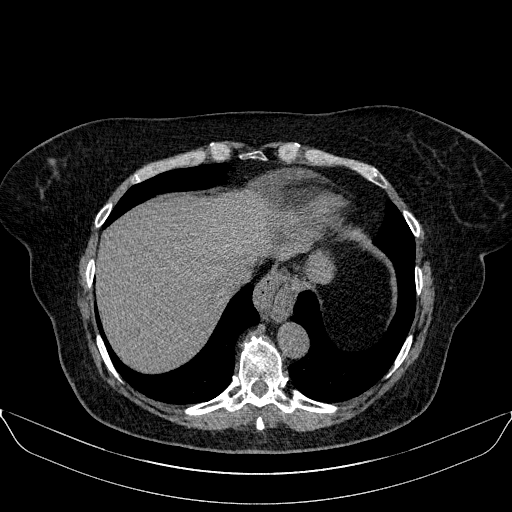
[im 115/344  lung]
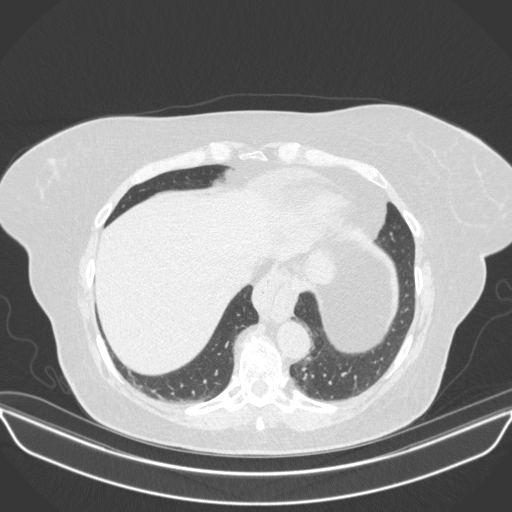
[im 127/344  lung]
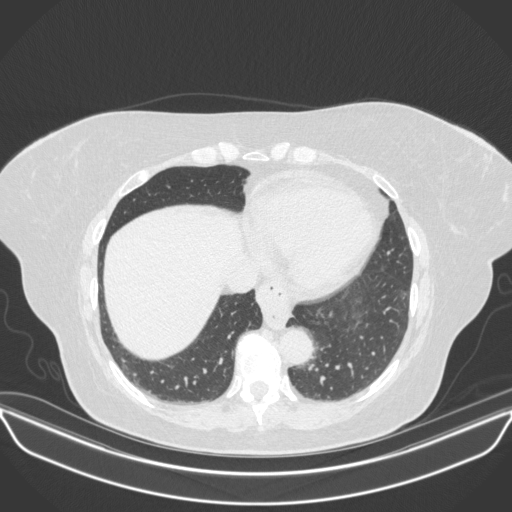
[im 160/344  lung]
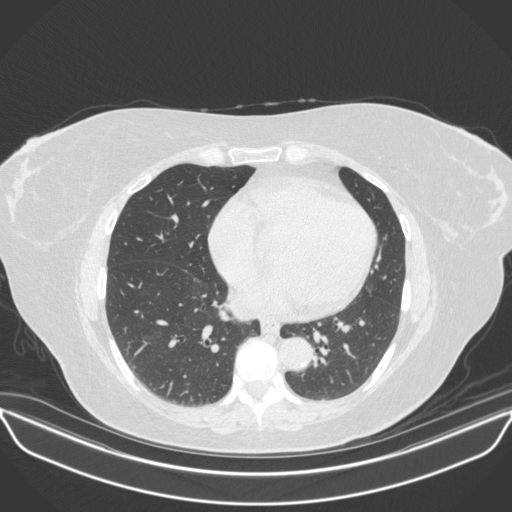
[im 163/344  lung]
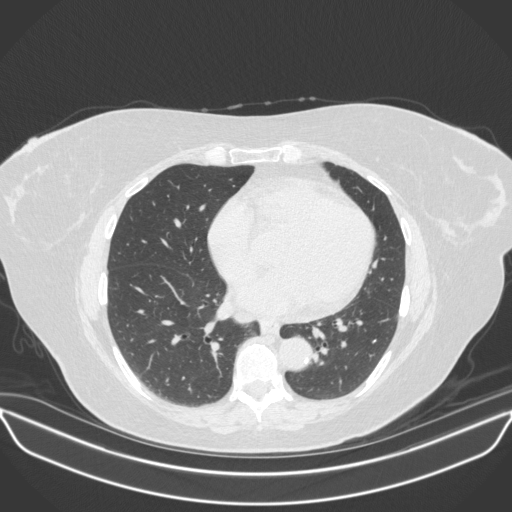
[im 181/344  mediastinal]
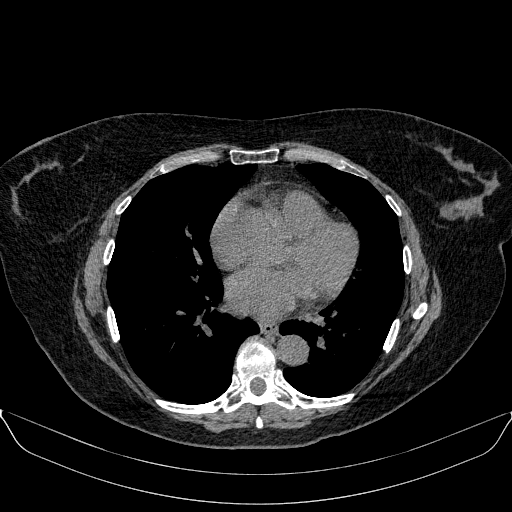
[im 181/344  lung]
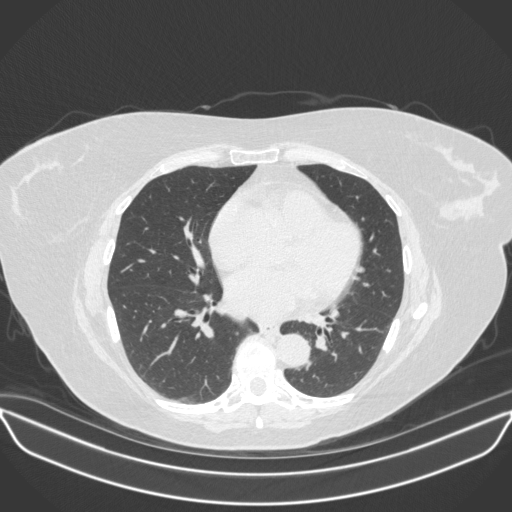
[im 217/344  lung]
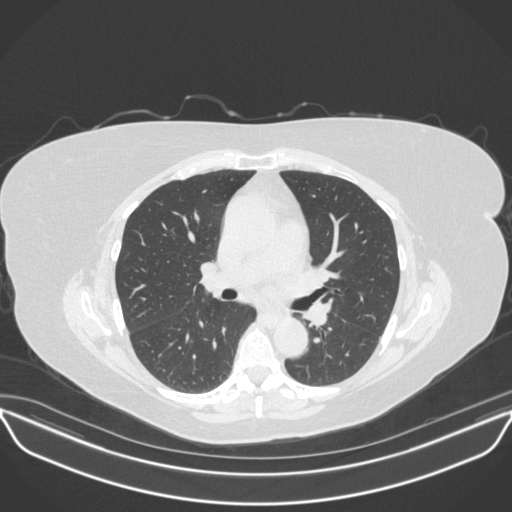
[im 229/344  lung]
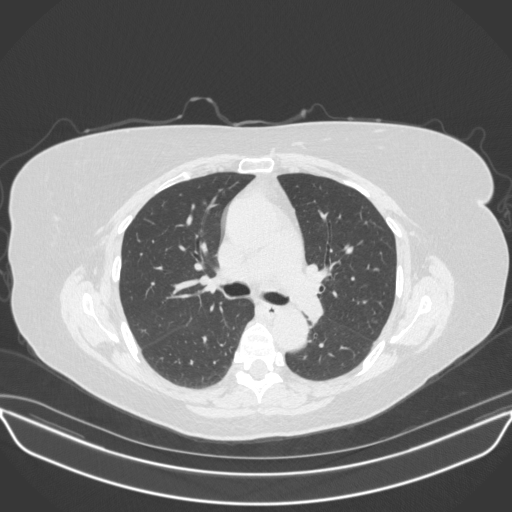
[im 253/344  lung]
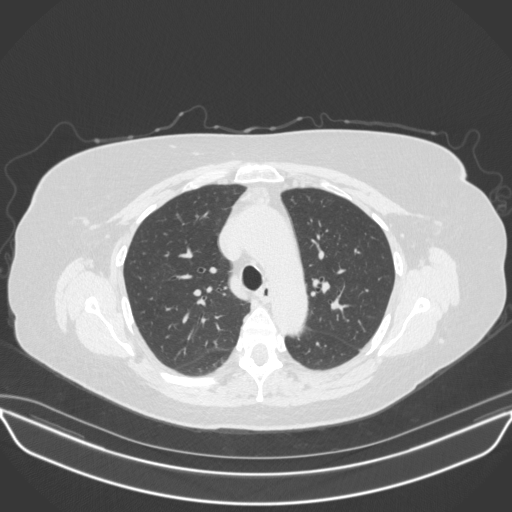
[im 271/344  mediastinal]
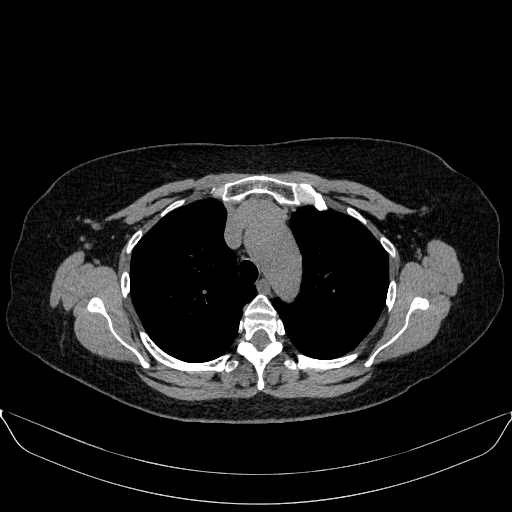
[im 271/344  lung]
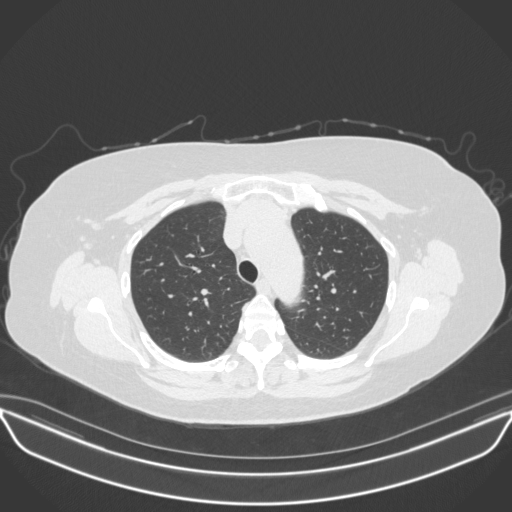
[im 289/344  lung]
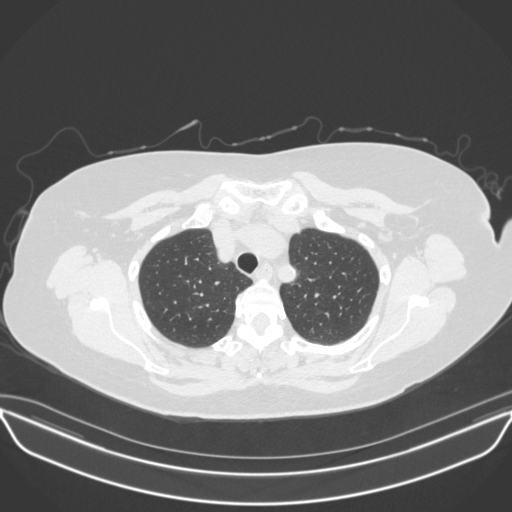
[im 325/344  lung]
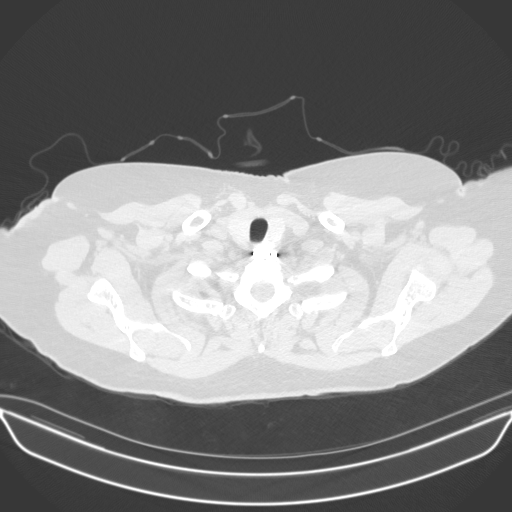

[15 of 32 positions shown; findings below may reference images not displayed]

FINDINGS: Cardiovascular: Unenhanced imaging of the heart demonstrates no
cardiomegaly. There is trace pericardial effusion unchanged. 4 cm
ascending thoracic aortic aneurysm. Evaluation of the aortic lumen
is limited without IV contrast.

Mediastinum/Nodes: No enlarged mediastinal or axillary lymph nodes.
Thyroid gland, trachea, and esophagus demonstrate no significant
findings. Small hiatal hernia.

Lungs/Pleura: No acute airspace disease, effusion, or pneumothorax.
Central airways are patent. Stable 2 mm calcified right lower lobe
pulmonary nodule image 76/5, consistent with benign granuloma. No
follow-up is indicated. No new pulmonary nodules or masses.

Upper Abdomen: Left nephrectomy. Compensatory hypertrophy right
kidney. No acute upper abdominal findings.

Musculoskeletal: No acute or destructive bony lesions. Reconstructed
images demonstrate no additional findings.
IMPRESSION: 1. Benign 2 mm right lower lobe calcified granuloma. No further
imaging follow-up is indicated.
2. Small hiatal hernia.
3. Stable 4 cm ascending thoracic aortic aneurysm. Recommend annual
imaging followup by CTA or MRA. This recommendation follows 4303
ACCF/AHA/AATS/ACR/ASA/SCA/MIE/JAILTON/AGOSTO/FALLER Guidelines for the
Diagnosis and Management of Patients with Thoracic Aortic Disease.
Circulation. 4303; 121: E266-e369. Aortic aneurysm NOS ([id number])
4. Trace pericardial effusion unchanged.

## 2023-12-11 DIAGNOSIS — M25531 Pain in right wrist: Secondary | ICD-10-CM | POA: Diagnosis not present

## 2023-12-15 DIAGNOSIS — M25531 Pain in right wrist: Secondary | ICD-10-CM | POA: Diagnosis not present

## 2023-12-16 DIAGNOSIS — N182 Chronic kidney disease, stage 2 (mild): Secondary | ICD-10-CM | POA: Diagnosis not present

## 2023-12-16 DIAGNOSIS — G43E19 Chronic migraine with aura, intractable, without status migrainosus: Secondary | ICD-10-CM | POA: Diagnosis not present

## 2023-12-16 DIAGNOSIS — F4321 Adjustment disorder with depressed mood: Secondary | ICD-10-CM | POA: Diagnosis not present

## 2023-12-17 ENCOUNTER — Encounter: Payer: Self-pay | Admitting: Cardiology

## 2023-12-17 ENCOUNTER — Telehealth: Payer: Self-pay | Admitting: Cardiology

## 2023-12-17 NOTE — Telephone Encounter (Signed)
Pt is requesting a callback regarding her wanting a sooner appt due to the one scheduled is not soon enough for her so she'd like to speak with the nurse or MD. Please advise

## 2023-12-17 NOTE — Telephone Encounter (Signed)
Call to patient to assist with scheduling. Patient states she was originally supposed to be seen 12/27/23 but had conflict and had to reschedule. She states she was rescheduled to April and she feels this is  too long to wait for an appointment. She states she tried 2 medications in the last year to help with her chronic chest pain and neither worked well. She was last prescribed isosorbide mononitrate but she would like to talk to Dr. Jacinto Halim about her options. She reports she is having chest pain at rest, which resolves quickly or with nitro,  about 1 episode a week. She also reports SOB on exertion that resolves with rest.   Offered patient an appt w/ Dr. Jacinto Halim 12/29/23, patient accepted. Advised patient to call 911 if chest pain worsens or is not relieved with nitroglycerin. Patient verbalizes understanding.

## 2023-12-18 NOTE — Telephone Encounter (Signed)
Please add her to my 1/2 day schedule in 2 weeks. I am okay to work beyond 12 pm

## 2023-12-21 NOTE — Telephone Encounter (Signed)
Reviewed with Dr Jacinto Halim and OK to keep 2/5 appointment as scheduled

## 2023-12-27 ENCOUNTER — Ambulatory Visit: Payer: 59 | Admitting: Cardiology

## 2023-12-29 ENCOUNTER — Encounter: Payer: Self-pay | Admitting: Cardiology

## 2023-12-29 ENCOUNTER — Ambulatory Visit: Payer: 59 | Attending: Cardiology | Admitting: Cardiology

## 2023-12-29 VITALS — BP 100/62 | HR 86 | Resp 16 | Ht 61.0 in | Wt 179.6 lb

## 2023-12-29 DIAGNOSIS — E7800 Pure hypercholesterolemia, unspecified: Secondary | ICD-10-CM

## 2023-12-29 DIAGNOSIS — I77819 Aortic ectasia, unspecified site: Secondary | ICD-10-CM | POA: Diagnosis not present

## 2023-12-29 DIAGNOSIS — R0609 Other forms of dyspnea: Secondary | ICD-10-CM | POA: Diagnosis not present

## 2023-12-29 DIAGNOSIS — I201 Angina pectoris with documented spasm: Secondary | ICD-10-CM | POA: Diagnosis not present

## 2023-12-29 NOTE — Progress Notes (Signed)
 Cardiology Office Note:  .   Date:  12/29/2023  ID:  Sylvia [name], DOB Aug 16, 1966, MRN [id number] PCP: Sylvia Ezekiel NOVAK, MD  [facility] Providers Cardiologist:  Gordy Bergamo, MD   History of Present Illness: .   Sylvia [name] is a 58 y.o. female patient with closed head injury in 2016 and since then has had frequent headaches, anxiety, mild OSA not on CPAP, hypersomnia due to poor sleeping habits, thoracic aortic ectasia/mild aortic dilatation, symptoms of chest pain suggestive of vasospastic/Prinzmetal's angina, mother had heart conditions and cardiomyopathy and died at age 27 years although history is not very clear may have an appointment to see me due to recurrence of chest pain.  She has had normal coronary CTA on 02/27/2020 revealing coronary calcium  score of 0 with no evidence of CAD.  Patient was admitted with sepsis in February 2024 and all her medications were discontinued she also developed acute renal failure which is now resolved.  She is back on all her antihypertensive and antianginal medications. Past medical history also significant for left nephrectomy (donated) cervical spine fusion laparoscopic cholecystectomy and hiatal hernia repair in 2021.  Discussed the use of AI scribe software for clinical note transcription with the patient, who gave verbal consent to proceed.  History of Present Illness   Sylvia [name] is a 58 year old female who presents with shortness of breath on exertion and exertional chest pain with radiation to the neck suggestive of angina pectoris. She expresses difficulty with physical exertion, such as climbing stairs or dancing, due to shortness of breath and heart racing. She recently went hiking and found it challenging but managed by choosing easier trails. NO PND or orthopnea. NO change in her weight and no leg edema.  She experiences chest pain and shortness of breath on exertion, with the chest pain sometimes radiating to the jaw  and accompanied by lightheadedness.    She has a history of kidney issues, including a kidney stone and dehydration. Her kidney function has returned to normal as of January 2025.  Labs   External Labs:  Care Everywhere labs 12/16/2023:  Hb 15.0/HCT 44.2, platelets 215, normal indicis.  Sodium 140, potassium 4.9, BUN 11, creatinine 0.9, EGFR 74 mL. Review of Systems  Cardiovascular:  Positive for chest pain and dyspnea on exertion. Negative for leg swelling.    Physical Exam:   VS:  BP 100/62 (BP Location: Left Arm, Patient Position: Sitting, Cuff Size: Normal)   Pulse 86   Resp 16   Ht 5' 1 (1.549 m)   Wt 179 lb 9.6 oz (81.5 kg)   SpO2 97%   PF 93 L/min   BMI 33.94 kg/m    Wt Readings from Last 3 Encounters:  12/29/23 179 lb 9.6 oz (81.5 kg)  04/12/23 173 lb 9.6 oz (78.7 kg)  02/22/23 173 lb (78.5 kg)    Physical Exam Constitutional:      Appearance: She is obese.  Neck:     Vascular: No carotid bruit or JVD.  Cardiovascular:     Rate and Rhythm: Normal rate and regular rhythm.     Pulses: Intact distal pulses.     Heart sounds: Normal heart sounds. No murmur heard.    No gallop.  Pulmonary:     Effort: Pulmonary effort is normal.     Breath sounds: Normal breath sounds.  Abdominal:     General: Bowel sounds are normal.     Palpations: Abdomen is soft.  Musculoskeletal:     Right lower leg: No edema.     Left lower leg: No edema.     Studies Reviewed: SABRA    Coronary CTA 02/27/2020: 1. Coronary calcium  score of 0. This was 0 percentile for age and sex matched control. 2. Normal coronary origin with right dominance. 3. No evidence of CAD. 4. Mildly dilated ascending aorta at 38-55mm. Minimal calcific lesions scattered.  5. Consider non atherosclerotic causes of chest pain.  Echocardiogram 04/05/2023: Normal LV systolic function with visual EF 60-65%. Left ventricle cavity is normal in size. Normal left ventricular wall thickness. Normal global wall  motion. Normal diastolic filling pattern, normal LAP. Calculated EF 61%. Structurally normal trileaflet aortic valve. Mild (Grade I) aortic regurgitation. Structurally normal tricuspid valve. Mild to moderate tricuspid regurgitation. No evidence of pulmonary hypertension. The aortic root is mildly dilated at STJ 3.9 cm. Mildly dilated ascending aorta at 4.0 cm. No significant change compared to 02/2021.  EKG:    EKG Interpretation Date/Time:  Wednesday December 29 2023 14:43:48 EST Ventricular Rate:  80 PR Interval:  146 QRS Duration:  78 QT Interval:  374 QTC Calculation: 431 R Axis:   -20  Text Interpretation: EKG 12/29/2023: Normal sinus rhythm at the rate of 80 bpm.  Anterolateral infarct old.  Cannot exclude inferior infarct old.  Borderline criteria for LVH in aVL.  Compared to 12/25/2022, comparison not performed due to lead placement.  However no change from 04/12/2023. Confirmed by Toya Palacios, Jagadeesh [phone]) on 12/29/2023 3:05:03 PM    EKG 04/12/2023: Normal sinus rhythm at the rate of 72 bpm, cannot exclude inferior infarct old. Poor R progression, cannot exclude anteroseptal infarct old. No evidence of ischemia.   Medications and allergies    Allergies  Allergen Reactions   Bee Venom Anaphylaxis    Yellow jackets- systemic reaction Hornets- local reaction   Iodine     Nauseous, cramping, vomiting, blackout    Nsaids     Cannot take nsaids- only has one kidney   Sulfa Antibiotics Rash   Tape Rash    Pt reports severe reaction to clear tape      Current Outpatient Medications:    amLODipine  (NORVASC ) 5 MG tablet, TAKE 1 TABLET(5 MG) BY MOUTH DAILY, Disp: 90 tablet, Rfl: 2   botulinum toxin Type A  (BOTOX ) 100 units SOLR injection, INJECT 155 UNITS INTO THE MUSCLES OF THE HEAD AND NECK EVERY 3 MONTHS. TO BE ADMINISTERED BY PROVIDER. DISCARD UNUSED PORTION., Disp: 2 each, Rfl: 3   Botulinum Toxin Type A  200 units SOLR, Inject as directed Takes injections every 3 months, Disp: ,  Rfl:    Calcium  Citrate (CITRACAL PO), Take 1 tablet by mouth daily., Disp: , Rfl:    cetirizine (ZYRTEC) 10 MG tablet, Take 10 mg by mouth daily., Disp: , Rfl:    colesevelam (WELCHOL) 625 MG tablet, Take 625 mg by mouth daily with breakfast., Disp: , Rfl:    EPINEPHrine 0.3 mg/0.3 mL IJ SOAJ injection, Inject 0.3 mg into the muscle as needed., Disp: , Rfl:    LORazepam  (ATIVAN ) 0.5 MG tablet, Take 0.5-1 mg by mouth 2 (two) times daily as needed for anxiety., Disp: , Rfl:    losartan  (COZAAR ) 25 MG tablet, Take 1 tablet (25 mg total) by mouth daily., Disp: 90 tablet, Rfl: 3   Rimegepant Sulfate (NURTEC) 75 MG TBDP, Take 75 mg by mouth daily as needed. For migraines. Take as close to onset of migraine as possible. One daily maximum. (Patient  taking differently: Take 75 mg by mouth daily as needed (migraine). Take as close to onset of migraine as possible. One daily maximum.), Disp: 10 tablet, Rfl: 6   rosuvastatin  (CRESTOR ) 10 MG tablet, Take 10 mg by mouth daily., Disp: , Rfl:    zolpidem  (AMBIEN  CR) 12.5 MG CR tablet, Take 12.5 mg by mouth once., Disp: , Rfl:    nitroGLYCERIN  (NITROSTAT ) 0.4 MG SL tablet, Place 1 tablet (0.4 mg total) under the tongue every 5 (five) minutes as needed for up to 25 days for chest pain., Disp: 25 tablet, Rfl: 3   ASSESSMENT AND PLAN: .      ICD-10-CM   1. Vasospastic angina (HCC)  I20.1 EKG 12-Lead    2. Dyspnea on exertion  R06.09 ECHOCARDIOGRAM COMPLETE    3. Pure hypercholesterolemia  E78.00     4. Ascending Aortic dilatation (HCC) at 38-32mm stable since 2021  I77.819 ECHOCARDIOGRAM COMPLETE      Vasospastic angina (HCC) Chest Pain with Dyspnea on Exertion Intermittent chest pain radiating to the jaw, associated with lightheadedness and dyspnea on exertion for two months, now improved. Previous cardiac CT angiogram and coronary calcium  score were normal. Possible vasospastic angina considered. Differential includes deconditioning and pulmonary  hypertension. Echocardiogram from May 2024 was normal. Discussed potential heart catheterization if symptoms persist. Emphasized importance of graded exercise program and reassured that current medications provide good protection against major cardiac events. - Order echocardiogram to reassess heart function and valve status - Recommend graded exercise program - Advise monitoring symptoms and reporting any worsening - Discuss potential heart catheterization if symptoms persist  Hypertension Managed with losartan  25 mg daily, and amlodipine  5 mg daily. Blood pressure well-controlled. - Continue current antihypertensive medications  Hyperlipidemia On rosuvastatin  (Crestor ) 10 mg daily. Latest lipid panel results pending. - Obtain latest lipid panel from PCP. Goal LDL < 70 in view of absence of atherosclerosis but presence of angina which is probably suggestive of Vasospastic angina.   General Health Maintenance Advised structured exercise program and weight loss strategies. Discussed benefits of Weight Watchers and weight loss injections. Emphasized maintaining good hydration, especially with one kidney. - Recommend joining an exercise class - Consider Weight Watchers program for weight loss - Advise maintaining good hydration  Follow-up - Schedule follow-up appointment in one year   Signed,  Gordy Bergamo, MD, [facility] 12/29/2023, 9:21 PM [facility] [address] [place], [place] [address] Phone: [phone]. Fax:  [phone]

## 2023-12-29 NOTE — Patient Instructions (Signed)
 Medication Instructions:  Your physician recommends that you continue on your current medications as directed. Please refer to the Current Medication list given to you today.  *If you need a refill on your cardiac medications before your next appointment, please call your pharmacy*   Lab Work: none If you have labs (blood work) drawn today and your tests are completely normal, you will receive your results only by: MyChart Message (if you have MyChart) OR A paper copy in the mail If you have any lab test that is abnormal or we need to change your treatment, we will call you to review the results.   Testing/Procedures: Your physician has requested that you have an echocardiogram. Echocardiography is a painless test that uses sound waves to create images of your heart. It provides your doctor with information about the size and shape of your heart and how well your heart's chambers and valves are working. This procedure takes approximately one hour. There are no restrictions for this procedure. Please do NOT wear cologne, perfume, aftershave, or lotions (deodorant is allowed). Please arrive 15 minutes prior to your appointment time.  Please note: We ask at that you not bring children with you during ultrasound (echo/ vascular) testing. Due to room size and safety concerns, children are not allowed in the ultrasound rooms during exams. Our front office staff cannot provide observation of children in our lobby area while testing is being conducted. An adult accompanying a patient to their appointment will only be allowed in the ultrasound room at the discretion of the ultrasound technician under special circumstances. We apologize for any inconvenience.    Follow-Up: At [facility], you and your health needs are our priority.  As part of our continuing mission to provide you with exceptional heart care, we have created designated Provider Care Teams.  These Care Teams include your  primary Cardiologist (physician) and Advanced Practice Providers (APPs -  Physician Assistants and Nurse Practitioners) who all work together to provide you with the care you need, when you need it.  We recommend signing up for the patient portal called "MyChart".  Sign up information is provided on this After Visit Summary.  MyChart is used to connect with patients for Virtual Visits (Telemedicine).  Patients are able to view lab/test results, encounter notes, upcoming appointments, etc.  Non-urgent messages can be sent to your provider as well.   To learn more about what you can do with MyChart, go to [email or web address].    Your next appointment:   12 month(s)  Provider:   Yates Decamp, MD     Other Instructions

## 2023-12-30 DIAGNOSIS — F4321 Adjustment disorder with depressed mood: Secondary | ICD-10-CM | POA: Diagnosis not present

## 2023-12-30 DIAGNOSIS — B9789 Other viral agents as the cause of diseases classified elsewhere: Secondary | ICD-10-CM | POA: Diagnosis not present

## 2023-12-30 DIAGNOSIS — R0981 Nasal congestion: Secondary | ICD-10-CM | POA: Diagnosis not present

## 2023-12-30 DIAGNOSIS — J019 Acute sinusitis, unspecified: Secondary | ICD-10-CM | POA: Diagnosis not present

## 2024-01-04 ENCOUNTER — Other Ambulatory Visit: Payer: Self-pay

## 2024-01-04 DIAGNOSIS — B9689 Other specified bacterial agents as the cause of diseases classified elsewhere: Secondary | ICD-10-CM | POA: Diagnosis not present

## 2024-01-04 DIAGNOSIS — R059 Cough, unspecified: Secondary | ICD-10-CM | POA: Diagnosis not present

## 2024-01-04 DIAGNOSIS — J019 Acute sinusitis, unspecified: Secondary | ICD-10-CM | POA: Diagnosis not present

## 2024-01-04 DIAGNOSIS — I201 Angina pectoris with documented spasm: Secondary | ICD-10-CM

## 2024-01-04 DIAGNOSIS — R0981 Nasal congestion: Secondary | ICD-10-CM | POA: Diagnosis not present

## 2024-01-04 DIAGNOSIS — J208 Acute bronchitis due to other specified organisms: Secondary | ICD-10-CM | POA: Diagnosis not present

## 2024-01-04 MED ORDER — LOSARTAN POTASSIUM 25 MG PO TABS: 25.0000 mg | ORAL_TABLET | Freq: Every day | ORAL | 3 refills | Status: DC

## 2024-01-05 ENCOUNTER — Ambulatory Visit
Admission: RE | Admit: 2024-01-05 | Discharge: 2024-01-05 | Disposition: A | Discharge status: Home / Self Care | Payer: PPO | Source: Ambulatory Visit | Attending: Internal Medicine | Admitting: Internal Medicine

## 2024-01-05 ENCOUNTER — Other Ambulatory Visit: Payer: Self-pay | Admitting: Internal Medicine

## 2024-01-05 DIAGNOSIS — B9689 Other specified bacterial agents as the cause of diseases classified elsewhere: Secondary | ICD-10-CM

## 2024-01-17 DIAGNOSIS — B3731 Acute candidiasis of vulva and vagina: Secondary | ICD-10-CM | POA: Diagnosis not present

## 2024-01-17 DIAGNOSIS — R053 Chronic cough: Secondary | ICD-10-CM | POA: Diagnosis not present

## 2024-01-17 DIAGNOSIS — R252 Cramp and spasm: Secondary | ICD-10-CM | POA: Diagnosis not present

## 2024-01-18 ENCOUNTER — Encounter: Payer: Self-pay | Admitting: Cardiology

## 2024-01-18 ENCOUNTER — Ambulatory Visit (HOSPITAL_COMMUNITY): Payer: PPO | Attending: Cardiology

## 2024-01-18 DIAGNOSIS — I77819 Aortic ectasia, unspecified site: Secondary | ICD-10-CM | POA: Insufficient documentation

## 2024-01-18 DIAGNOSIS — R0609 Other forms of dyspnea: Secondary | ICD-10-CM | POA: Insufficient documentation

## 2024-01-18 LAB — ECHOCARDIOGRAM COMPLETE
Area-P 1/2: 6.27 cm2
P 1/2 time: 2299 ms
S' Lateral: 3 cm

## 2024-01-18 NOTE — Progress Notes (Signed)
 Your echocardiogram is very stable with normal LV systolic function and mild diastolic dysfunction. Diastolic function means the heart has to relax to receive the blood so it can pump the blood out. If relaxation is impaired, then the blood coming to the heart is restricted and can cause dyspnea and reduced functional capacity and fluid build up. Exercise, weight loss, control of BP, salt restriction will improve this.  Your aortic root enlargement is also very stable at 40 mm and unchanged from prior echocardiogram.

## 2024-02-08 DIAGNOSIS — N200 Calculus of kidney: Secondary | ICD-10-CM | POA: Diagnosis not present

## 2024-02-08 DIAGNOSIS — N182 Chronic kidney disease, stage 2 (mild): Secondary | ICD-10-CM | POA: Diagnosis not present

## 2024-02-14 DIAGNOSIS — G43E19 Chronic migraine with aura, intractable, without status migrainosus: Secondary | ICD-10-CM | POA: Diagnosis not present

## 2024-02-14 DIAGNOSIS — M5481 Occipital neuralgia: Secondary | ICD-10-CM | POA: Diagnosis not present

## 2024-02-24 DIAGNOSIS — F4321 Adjustment disorder with depressed mood: Secondary | ICD-10-CM | POA: Diagnosis not present

## 2024-02-29 ENCOUNTER — Ambulatory Visit: Payer: 59 | Admitting: Cardiology

## 2024-02-29 ENCOUNTER — Telehealth: Payer: Self-pay | Admitting: Cardiology

## 2024-02-29 MED ORDER — NITROGLYCERIN 0.4 MG/SPRAY TL SOLN: 1.0000 | 6 refills | Status: AC | PRN

## 2024-02-29 NOTE — Telephone Encounter (Signed)
 Spoke with patient and she states last week, her chest was hurting so bad, she was nauseate, at left arm pain, and she was sweating. It lasted a couple of minutes. She did not go the ED. She also did not have   her nitroglycerin to take. It hasn't happened since. She would like to know what recommendations you have for her  Informed patient she should carry her nitroglycerin with her everyday. ED  precautions discussed.

## 2024-02-29 NOTE — Telephone Encounter (Signed)
 I spoke with patient and gave her message from Dr Jacinto Halim.  She would like prescription for NTG spray sent to pharmacy.  Prescription sent to [facility] in [place].  Patient will let us know if she has more episodes of pain.

## 2024-02-29 NOTE — Telephone Encounter (Signed)
 New message  Patient came in today thinking she still had her 02-29-24 appt with Dr Jacinto Halim.  Appointment was canceled because she was seen in feb.  At that time, Dr Jacinto Halim told her to return in 1 yr and she was also scheduled an echo.  Patient wanted Dr Jacinto Halim to know that last Friday she had a 5 minute "episode".  It consisted of left arm pain, sweating and squeezing chest pressure.  Patient said it lasted approx 5 minutes.  She did not call 911 or take nitroglycerin.  Since that time, she has not had any more episodes.  Please adivse on next step to take.

## 2024-02-29 NOTE — Telephone Encounter (Signed)
 Best to Rx NTG Spray for easy of carrying with her if she prefers. Agree and will wait and see if she has frequent episodes

## 2024-03-01 DIAGNOSIS — G4486 Cervicogenic headache: Secondary | ICD-10-CM | POA: Diagnosis not present

## 2024-03-01 DIAGNOSIS — G43E19 Chronic migraine with aura, intractable, without status migrainosus: Secondary | ICD-10-CM | POA: Diagnosis not present

## 2024-03-01 DIAGNOSIS — M542 Cervicalgia: Secondary | ICD-10-CM | POA: Diagnosis not present

## 2024-03-01 DIAGNOSIS — M6281 Muscle weakness (generalized): Secondary | ICD-10-CM | POA: Diagnosis not present

## 2024-03-02 DIAGNOSIS — N200 Calculus of kidney: Secondary | ICD-10-CM | POA: Diagnosis not present

## 2024-03-16 DIAGNOSIS — F4321 Adjustment disorder with depressed mood: Secondary | ICD-10-CM | POA: Diagnosis not present

## 2024-03-16 DIAGNOSIS — N200 Calculus of kidney: Secondary | ICD-10-CM | POA: Diagnosis not present

## 2024-03-23 DIAGNOSIS — E782 Mixed hyperlipidemia: Secondary | ICD-10-CM | POA: Diagnosis not present

## 2024-03-23 DIAGNOSIS — R7989 Other specified abnormal findings of blood chemistry: Secondary | ICD-10-CM | POA: Diagnosis not present

## 2024-03-23 DIAGNOSIS — Z131 Encounter for screening for diabetes mellitus: Secondary | ICD-10-CM | POA: Diagnosis not present

## 2024-03-23 DIAGNOSIS — E559 Vitamin D deficiency, unspecified: Secondary | ICD-10-CM | POA: Diagnosis not present

## 2024-03-23 DIAGNOSIS — Z0001 Encounter for general adult medical examination with abnormal findings: Secondary | ICD-10-CM | POA: Diagnosis not present

## 2024-03-23 DIAGNOSIS — E042 Nontoxic multinodular goiter: Secondary | ICD-10-CM | POA: Diagnosis not present

## 2024-03-29 DIAGNOSIS — G4486 Cervicogenic headache: Secondary | ICD-10-CM | POA: Diagnosis not present

## 2024-03-29 DIAGNOSIS — G43E19 Chronic migraine with aura, intractable, without status migrainosus: Secondary | ICD-10-CM | POA: Diagnosis not present

## 2024-03-29 DIAGNOSIS — M542 Cervicalgia: Secondary | ICD-10-CM | POA: Diagnosis not present

## 2024-03-29 DIAGNOSIS — M6281 Muscle weakness (generalized): Secondary | ICD-10-CM | POA: Diagnosis not present

## 2024-03-29 DIAGNOSIS — M5481 Occipital neuralgia: Secondary | ICD-10-CM | POA: Diagnosis not present

## 2024-03-31 DIAGNOSIS — E782 Mixed hyperlipidemia: Secondary | ICD-10-CM | POA: Diagnosis not present

## 2024-03-31 DIAGNOSIS — Z1239 Encounter for other screening for malignant neoplasm of breast: Secondary | ICD-10-CM | POA: Diagnosis not present

## 2024-03-31 DIAGNOSIS — M546 Pain in thoracic spine: Secondary | ICD-10-CM | POA: Diagnosis not present

## 2024-03-31 DIAGNOSIS — Z13820 Encounter for screening for osteoporosis: Secondary | ICD-10-CM | POA: Diagnosis not present

## 2024-03-31 DIAGNOSIS — N62 Hypertrophy of breast: Secondary | ICD-10-CM | POA: Diagnosis not present

## 2024-03-31 DIAGNOSIS — Z1211 Encounter for screening for malignant neoplasm of colon: Secondary | ICD-10-CM | POA: Diagnosis not present

## 2024-03-31 DIAGNOSIS — M542 Cervicalgia: Secondary | ICD-10-CM | POA: Diagnosis not present

## 2024-03-31 DIAGNOSIS — Z23 Encounter for immunization: Secondary | ICD-10-CM | POA: Diagnosis not present

## 2024-03-31 DIAGNOSIS — F418 Other specified anxiety disorders: Secondary | ICD-10-CM | POA: Diagnosis not present

## 2024-03-31 DIAGNOSIS — Z01419 Encounter for gynecological examination (general) (routine) without abnormal findings: Secondary | ICD-10-CM | POA: Diagnosis not present

## 2024-03-31 DIAGNOSIS — Z0001 Encounter for general adult medical examination with abnormal findings: Secondary | ICD-10-CM | POA: Diagnosis not present

## 2024-03-31 DIAGNOSIS — M609 Myositis, unspecified: Secondary | ICD-10-CM | POA: Diagnosis not present

## 2024-04-11 ENCOUNTER — Ambulatory Visit: Payer: Medicare HMO | Admitting: Cardiology

## 2024-04-11 DIAGNOSIS — G43E19 Chronic migraine with aura, intractable, without status migrainosus: Secondary | ICD-10-CM | POA: Diagnosis not present

## 2024-04-11 DIAGNOSIS — M5481 Occipital neuralgia: Secondary | ICD-10-CM | POA: Diagnosis not present

## 2024-04-13 ENCOUNTER — Other Ambulatory Visit: Payer: Self-pay | Admitting: Internal Medicine

## 2024-04-13 ENCOUNTER — Ambulatory Visit
Admission: RE | Admit: 2024-04-13 | Discharge: 2024-04-13 | Disposition: A | Discharge status: Home / Self Care | Source: Ambulatory Visit | Attending: Internal Medicine | Admitting: Internal Medicine

## 2024-04-13 DIAGNOSIS — M546 Pain in thoracic spine: Secondary | ICD-10-CM

## 2024-04-13 DIAGNOSIS — M5134 Other intervertebral disc degeneration, thoracic region: Secondary | ICD-10-CM | POA: Diagnosis not present

## 2024-04-24 DIAGNOSIS — R351 Nocturia: Secondary | ICD-10-CM | POA: Diagnosis not present

## 2024-04-24 DIAGNOSIS — R350 Frequency of micturition: Secondary | ICD-10-CM | POA: Diagnosis not present

## 2024-04-24 DIAGNOSIS — R3915 Urgency of urination: Secondary | ICD-10-CM | POA: Diagnosis not present

## 2024-05-03 ENCOUNTER — Other Ambulatory Visit: Payer: Self-pay | Admitting: Internal Medicine

## 2024-05-03 DIAGNOSIS — Z1231 Encounter for screening mammogram for malignant neoplasm of breast: Secondary | ICD-10-CM

## 2024-05-09 DIAGNOSIS — M609 Myositis, unspecified: Secondary | ICD-10-CM | POA: Diagnosis not present

## 2024-05-09 DIAGNOSIS — G453 Amaurosis fugax: Secondary | ICD-10-CM | POA: Diagnosis not present

## 2024-05-09 DIAGNOSIS — T466X5A Adverse effect of antihyperlipidemic and antiarteriosclerotic drugs, initial encounter: Secondary | ICD-10-CM | POA: Diagnosis not present

## 2024-05-09 DIAGNOSIS — J208 Acute bronchitis due to other specified organisms: Secondary | ICD-10-CM | POA: Diagnosis not present

## 2024-05-09 DIAGNOSIS — A09 Infectious gastroenteritis and colitis, unspecified: Secondary | ICD-10-CM | POA: Diagnosis not present

## 2024-05-09 DIAGNOSIS — U071 COVID-19: Secondary | ICD-10-CM | POA: Diagnosis not present

## 2024-05-15 ENCOUNTER — Other Ambulatory Visit (HOSPITAL_BASED_OUTPATIENT_CLINIC_OR_DEPARTMENT_OTHER): Payer: Self-pay | Admitting: Internal Medicine

## 2024-05-15 DIAGNOSIS — H538 Other visual disturbances: Secondary | ICD-10-CM | POA: Diagnosis not present

## 2024-05-15 DIAGNOSIS — Z13820 Encounter for screening for osteoporosis: Secondary | ICD-10-CM

## 2024-05-19 DIAGNOSIS — U099 Post covid-19 condition, unspecified: Secondary | ICD-10-CM | POA: Diagnosis not present

## 2024-05-23 DIAGNOSIS — G43E19 Chronic migraine with aura, intractable, without status migrainosus: Secondary | ICD-10-CM | POA: Diagnosis not present

## 2024-05-30 ENCOUNTER — Other Ambulatory Visit: Payer: Self-pay

## 2024-05-30 MED ORDER — AMLODIPINE BESYLATE 5 MG PO TABS: 5.0000 mg | ORAL_TABLET | Freq: Every day | ORAL | 2 refills | Status: DC

## 2024-06-15 DIAGNOSIS — N62 Hypertrophy of breast: Secondary | ICD-10-CM | POA: Diagnosis not present

## 2024-06-15 DIAGNOSIS — M549 Dorsalgia, unspecified: Secondary | ICD-10-CM | POA: Diagnosis not present

## 2024-06-15 DIAGNOSIS — G8929 Other chronic pain: Secondary | ICD-10-CM | POA: Diagnosis not present

## 2024-06-15 DIAGNOSIS — M542 Cervicalgia: Secondary | ICD-10-CM | POA: Diagnosis not present

## 2024-06-16 ENCOUNTER — Ambulatory Visit
Admission: RE | Admit: 2024-06-16 | Discharge: 2024-06-16 | Disposition: A | Discharge status: Home / Self Care | Source: Ambulatory Visit | Attending: Internal Medicine | Admitting: Internal Medicine

## 2024-06-16 DIAGNOSIS — Z1231 Encounter for screening mammogram for malignant neoplasm of breast: Secondary | ICD-10-CM

## 2024-06-18 ENCOUNTER — Encounter: Payer: Self-pay | Admitting: Cardiology

## 2024-06-22 MED ORDER — AMLODIPINE BESYLATE 5 MG PO TABS: 5.0000 mg | ORAL_TABLET | Freq: Every day | ORAL | 0 refills | Status: DC
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
## 2024-07-25 DIAGNOSIS — R159 Full incontinence of feces: Secondary | ICD-10-CM | POA: Diagnosis not present

## 2024-07-25 DIAGNOSIS — K5730 Diverticulosis of large intestine without perforation or abscess without bleeding: Secondary | ICD-10-CM | POA: Diagnosis not present

## 2024-07-25 DIAGNOSIS — R152 Fecal urgency: Secondary | ICD-10-CM | POA: Diagnosis not present

## 2024-07-25 DIAGNOSIS — K9089 Other intestinal malabsorption: Secondary | ICD-10-CM | POA: Diagnosis not present

## 2024-07-26 ENCOUNTER — Other Ambulatory Visit: Payer: Self-pay

## 2024-07-26 ENCOUNTER — Encounter (HOSPITAL_BASED_OUTPATIENT_CLINIC_OR_DEPARTMENT_OTHER): Payer: Self-pay | Admitting: Plastic Surgery

## 2024-07-26 MED ORDER — CHLORHEXIDINE GLUCONATE CLOTH 2 % EX PADS: 6.0000 | MEDICATED_PAD | Freq: Once | CUTANEOUS | Status: DC

## 2024-07-26 MED ORDER — CHLORHEXIDINE GLUCONATE CLOTH 2 % EX PADS
6.0000 | MEDICATED_PAD | Freq: Once | CUTANEOUS | Status: DC
Start: 2024-07-26 — End: 2024-08-01

## 2024-07-26 NOTE — Progress Notes (Signed)

## 2024-08-01 ENCOUNTER — Ambulatory Visit (HOSPITAL_BASED_OUTPATIENT_CLINIC_OR_DEPARTMENT_OTHER): Admitting: Anesthesiology

## 2024-08-01 ENCOUNTER — Ambulatory Visit (HOSPITAL_BASED_OUTPATIENT_CLINIC_OR_DEPARTMENT_OTHER)
Admission: RE | Admit: 2024-08-01 | Discharge: 2024-08-01 | Disposition: A | Discharge status: Home / Self Care | Attending: Plastic Surgery | Admitting: Plastic Surgery

## 2024-08-01 ENCOUNTER — Encounter (HOSPITAL_BASED_OUTPATIENT_CLINIC_OR_DEPARTMENT_OTHER): Payer: Self-pay | Admitting: Plastic Surgery

## 2024-08-01 ENCOUNTER — Encounter (HOSPITAL_BASED_OUTPATIENT_CLINIC_OR_DEPARTMENT_OTHER)
Admission: RE | Disposition: A | Discharge status: Home / Self Care | Payer: Self-pay | Source: Home / Self Care | Attending: Plastic Surgery

## 2024-08-01 ENCOUNTER — Other Ambulatory Visit: Payer: Self-pay

## 2024-08-01 DIAGNOSIS — N62 Hypertrophy of breast: Secondary | ICD-10-CM | POA: Diagnosis not present

## 2024-08-01 DIAGNOSIS — Z7982 Long term (current) use of aspirin: Secondary | ICD-10-CM | POA: Insufficient documentation

## 2024-08-01 DIAGNOSIS — G4733 Obstructive sleep apnea (adult) (pediatric): Secondary | ICD-10-CM | POA: Insufficient documentation

## 2024-08-01 DIAGNOSIS — G8929 Other chronic pain: Secondary | ICD-10-CM | POA: Diagnosis not present

## 2024-08-01 DIAGNOSIS — N6031 Fibrosclerosis of right breast: Secondary | ICD-10-CM | POA: Diagnosis not present

## 2024-08-01 DIAGNOSIS — G43909 Migraine, unspecified, not intractable, without status migrainosus: Secondary | ICD-10-CM | POA: Insufficient documentation

## 2024-08-01 DIAGNOSIS — Z87891 Personal history of nicotine dependence: Secondary | ICD-10-CM | POA: Insufficient documentation

## 2024-08-01 DIAGNOSIS — Z905 Acquired absence of kidney: Secondary | ICD-10-CM | POA: Diagnosis not present

## 2024-08-01 DIAGNOSIS — M542 Cervicalgia: Secondary | ICD-10-CM | POA: Diagnosis not present

## 2024-08-01 DIAGNOSIS — M549 Dorsalgia, unspecified: Secondary | ICD-10-CM | POA: Diagnosis not present

## 2024-08-01 DIAGNOSIS — M5489 Other dorsalgia: Secondary | ICD-10-CM | POA: Diagnosis not present

## 2024-08-01 DIAGNOSIS — Z01818 Encounter for other preprocedural examination: Secondary | ICD-10-CM

## 2024-08-01 DIAGNOSIS — N6032 Fibrosclerosis of left breast: Secondary | ICD-10-CM | POA: Insufficient documentation

## 2024-08-01 DIAGNOSIS — Z981 Arthrodesis status: Secondary | ICD-10-CM | POA: Diagnosis not present

## 2024-08-01 DIAGNOSIS — R519 Headache, unspecified: Secondary | ICD-10-CM | POA: Diagnosis not present

## 2024-08-01 HISTORY — DX: Nausea with vomiting, unspecified: R11.2

## 2024-08-01 HISTORY — PX: BREAST REDUCTION SURGERY: SHX8

## 2024-08-01 HISTORY — DX: Other specified postprocedural states: Z98.890

## 2024-08-01 HISTORY — DX: Other complications of anesthesia, initial encounter: T88.59XA

## 2024-08-01 SURGERY — MAMMOPLASTY, REDUCTION
Anesthesia: General | Site: Breast | Laterality: Bilateral

## 2024-08-01 MED ORDER — ACETAMINOPHEN 10 MG/ML IV SOLN: 1000.0000 mg | Freq: Once | INTRAVENOUS | Status: DC | PRN

## 2024-08-01 MED ORDER — MIDAZOLAM HCL 2 MG/2ML IJ SOLN
INTRAMUSCULAR | Status: AC
  Filled 2024-08-01: qty 2

## 2024-08-01 MED ORDER — ROCURONIUM BROMIDE 10 MG/ML (PF) SYRINGE
PREFILLED_SYRINGE | INTRAVENOUS | Status: AC
  Filled 2024-08-01: qty 10

## 2024-08-01 MED ORDER — KETAMINE HCL 50 MG/5ML IJ SOSY
PREFILLED_SYRINGE | INTRAMUSCULAR | Status: AC
  Filled 2024-08-01: qty 5

## 2024-08-01 MED ORDER — PROPOFOL 500 MG/50ML IV EMUL
INTRAVENOUS | Status: DC | PRN
  Administered 2024-08-01: 150 ug/kg/min via INTRAVENOUS

## 2024-08-01 MED ORDER — PHENYLEPHRINE HCL (PRESSORS) 10 MG/ML IV SOLN
INTRAVENOUS | Status: DC | PRN
  Administered 2024-08-01 (×4): 80 ug via INTRAVENOUS

## 2024-08-01 MED ORDER — BUPIVACAINE HCL (PF) 0.5 % IJ SOLN
INTRAMUSCULAR | Status: DC | PRN
  Administered 2024-08-01: 30 mL

## 2024-08-01 MED ORDER — 0.9 % SODIUM CHLORIDE (POUR BTL) OPTIME
TOPICAL | Status: DC | PRN
  Administered 2024-08-01: 1000 mL

## 2024-08-01 MED ORDER — DEXAMETHASONE SODIUM PHOSPHATE 10 MG/ML IJ SOLN
INTRAMUSCULAR | Status: DC | PRN
  Administered 2024-08-01: 10 mg via INTRAVENOUS

## 2024-08-01 MED ORDER — FENTANYL CITRATE (PF) 100 MCG/2ML IJ SOLN
25.0000 ug | INTRAMUSCULAR | Status: DC | PRN
  Administered 2024-08-01: 25 ug via INTRAVENOUS
  Administered 2024-08-01: 50 ug via INTRAVENOUS
  Administered 2024-08-01: 25 ug via INTRAVENOUS

## 2024-08-01 MED ORDER — MIDAZOLAM HCL 5 MG/5ML IJ SOLN
INTRAMUSCULAR | Status: DC | PRN
  Administered 2024-08-01: 2 mg via INTRAVENOUS

## 2024-08-01 MED ORDER — DEXAMETHASONE SODIUM PHOSPHATE 10 MG/ML IJ SOLN
INTRAMUSCULAR | Status: AC
  Filled 2024-08-01: qty 1

## 2024-08-01 MED ORDER — LIDOCAINE 2% (20 MG/ML) 5 ML SYRINGE
INTRAMUSCULAR | Status: AC
  Filled 2024-08-01: qty 5

## 2024-08-01 MED ORDER — OXYCODONE HCL 5 MG PO TABS
ORAL_TABLET | ORAL | Status: AC
  Filled 2024-08-01: qty 1

## 2024-08-01 MED ORDER — OXYCODONE HCL 5 MG PO TABS
5.0000 mg | ORAL_TABLET | Freq: Once | ORAL | Status: AC | PRN
  Administered 2024-08-01: 5 mg via ORAL

## 2024-08-01 MED ORDER — DROPERIDOL 2.5 MG/ML IJ SOLN: 0.6250 mg | Freq: Once | INTRAMUSCULAR | Status: DC | PRN

## 2024-08-01 MED ORDER — PROPOFOL 10 MG/ML IV BOLUS
INTRAVENOUS | Status: AC
  Filled 2024-08-01: qty 20

## 2024-08-01 MED ORDER — SCOPOLAMINE 1 MG/3DAYS TD PT72
MEDICATED_PATCH | TRANSDERMAL | Status: AC
  Filled 2024-08-01: qty 1

## 2024-08-01 MED ORDER — FENTANYL CITRATE (PF) 100 MCG/2ML IJ SOLN
INTRAMUSCULAR | Status: AC
  Filled 2024-08-01: qty 2

## 2024-08-01 MED ORDER — ONDANSETRON HCL 4 MG/2ML IJ SOLN
INTRAMUSCULAR | Status: AC
  Filled 2024-08-01: qty 2

## 2024-08-01 MED ORDER — ACETAMINOPHEN 500 MG PO TABS
ORAL_TABLET | ORAL | Status: AC
  Filled 2024-08-01: qty 2

## 2024-08-01 MED ORDER — GABAPENTIN 300 MG PO CAPS
ORAL_CAPSULE | ORAL | Status: AC
  Filled 2024-08-01: qty 1

## 2024-08-01 MED ORDER — SCOPOLAMINE 1 MG/3DAYS TD PT72
1.0000 | MEDICATED_PATCH | TRANSDERMAL | Status: DC
  Administered 2024-08-01: 1 via TRANSDERMAL

## 2024-08-01 MED ORDER — ROCURONIUM BROMIDE 100 MG/10ML IV SOLN
INTRAVENOUS | Status: DC | PRN
  Administered 2024-08-01: 50 mg via INTRAVENOUS

## 2024-08-01 MED ORDER — OXYCODONE HCL 5 MG/5ML PO SOLN: 5.0000 mg | Freq: Once | ORAL | Status: AC | PRN

## 2024-08-01 MED ORDER — KETAMINE HCL 10 MG/ML IJ SOLN
INTRAMUSCULAR | Status: DC | PRN
  Administered 2024-08-01: 20 mg via INTRAVENOUS

## 2024-08-01 MED ORDER — LACTATED RINGERS IV SOLN: INTRAVENOUS | Status: DC

## 2024-08-01 MED ORDER — PHENYLEPHRINE HCL-NACL 20-0.9 MG/250ML-% IV SOLN
INTRAVENOUS | Status: DC | PRN
  Administered 2024-08-01: 25 ug/min via INTRAVENOUS

## 2024-08-01 MED ORDER — SUGAMMADEX SODIUM 200 MG/2ML IV SOLN
INTRAVENOUS | Status: DC | PRN
  Administered 2024-08-01: 150 mg via INTRAVENOUS

## 2024-08-01 MED ORDER — ACETAMINOPHEN 500 MG PO TABS
1000.0000 mg | ORAL_TABLET | ORAL | Status: AC
  Administered 2024-08-01: 1000 mg via ORAL

## 2024-08-01 MED ORDER — ONDANSETRON HCL 4 MG/2ML IJ SOLN
INTRAMUSCULAR | Status: DC | PRN
Start: 2024-08-01 — End: 2024-08-01
  Administered 2024-08-01: 4 mg via INTRAVENOUS

## 2024-08-01 MED ORDER — LIDOCAINE HCL (CARDIAC) PF 100 MG/5ML IV SOSY
PREFILLED_SYRINGE | INTRAVENOUS | Status: DC | PRN
  Administered 2024-08-01: 50 mg via INTRAVENOUS

## 2024-08-01 MED ORDER — FENTANYL CITRATE (PF) 100 MCG/2ML IJ SOLN
INTRAMUSCULAR | Status: DC | PRN
  Administered 2024-08-01 (×4): 50 ug via INTRAVENOUS

## 2024-08-01 MED ORDER — BUPIVACAINE HCL (PF) 0.5 % IJ SOLN
INTRAMUSCULAR | Status: AC
  Filled 2024-08-01: qty 30

## 2024-08-01 MED ORDER — PROPOFOL 10 MG/ML IV BOLUS
INTRAVENOUS | Status: DC | PRN
  Administered 2024-08-01: 150 mg via INTRAVENOUS

## 2024-08-01 MED ORDER — CEFAZOLIN SODIUM-DEXTROSE 2-4 GM/100ML-% IV SOLN
2.0000 g | INTRAVENOUS | Status: AC
  Administered 2024-08-01: 2 g via INTRAVENOUS

## 2024-08-01 MED ORDER — CEFAZOLIN SODIUM-DEXTROSE 2-4 GM/100ML-% IV SOLN
INTRAVENOUS | Status: AC
  Filled 2024-08-01: qty 100

## 2024-08-01 MED ORDER — GABAPENTIN 300 MG PO CAPS
300.0000 mg | ORAL_CAPSULE | ORAL | Status: AC
  Administered 2024-08-01: 300 mg via ORAL

## 2024-08-01 SURGICAL SUPPLY — 44 items
BINDER BREAST 3XL (GAUZE/BANDAGES/DRESSINGS) IMPLANT
BINDER BREAST LRG (GAUZE/BANDAGES/DRESSINGS) IMPLANT
BINDER BREAST MEDIUM (GAUZE/BANDAGES/DRESSINGS) IMPLANT
BINDER BREAST XLRG (GAUZE/BANDAGES/DRESSINGS) IMPLANT
BINDER BREAST XXLRG (GAUZE/BANDAGES/DRESSINGS) IMPLANT
BLADE SURG 10 STRL SS (BLADE) ×4 IMPLANT
BNDG GAUZE DERMACEA FLUFF 4 (GAUZE/BANDAGES/DRESSINGS) ×2 IMPLANT
CANISTER SUCT 1200ML W/VALVE (MISCELLANEOUS) ×1 IMPLANT
CHLORAPREP W/TINT 26 (MISCELLANEOUS) ×2 IMPLANT
COVER BACK TABLE 60X90IN (DRAPES) ×1 IMPLANT
COVER MAYO STAND STRL (DRAPES) ×1 IMPLANT
DERMABOND ADVANCED .7 DNX12 (GAUZE/BANDAGES/DRESSINGS) ×2 IMPLANT
DRAIN CHANNEL 15F RND FF W/TCR (WOUND CARE) IMPLANT
DRAIN CHANNEL 19F RND (DRAIN) IMPLANT
DRAPE TOP ARMCOVERS (MISCELLANEOUS) ×1 IMPLANT
DRAPE U-SHAPE 76X120 STRL (DRAPES) ×1 IMPLANT
DRAPE UTILITY XL STRL (DRAPES) ×1 IMPLANT
ELECT COATED BLADE 2.86 ST (ELECTRODE) ×1 IMPLANT
ELECTRODE REM PT RTRN 9FT ADLT (ELECTROSURGICAL) ×1 IMPLANT
EVACUATOR SILICONE 100CC (DRAIN) IMPLANT
GAUZE PAD ABD 8X10 STRL (GAUZE/BANDAGES/DRESSINGS) ×2 IMPLANT
GLOVE BIO SURGEON STRL SZ 6 (GLOVE) ×2 IMPLANT
GOWN STRL REUS W/ TWL LRG LVL3 (GOWN DISPOSABLE) ×2 IMPLANT
MARKER SKIN DUAL TIP RULER LAB (MISCELLANEOUS) IMPLANT
NDL HYPO 25X1 1.5 SAFETY (NEEDLE) ×1 IMPLANT
NEEDLE HYPO 25X1 1.5 SAFETY (NEEDLE) ×1 IMPLANT
NS IRRIG 1000ML POUR BTL (IV SOLUTION) ×1 IMPLANT
PACK BASIN DAY SURGERY FS (CUSTOM PROCEDURE TRAY) ×1 IMPLANT
PENCIL SMOKE EVACUATOR (MISCELLANEOUS) ×1 IMPLANT
PIN SAFETY STERILE (MISCELLANEOUS) ×1 IMPLANT
SHEET MEDIUM DRAPE 40X70 STRL (DRAPES) ×1 IMPLANT
SLEEVE SCD COMPRESS KNEE MED (STOCKING) ×1 IMPLANT
SPONGE T-LAP 18X18 ~~LOC~~+RFID (SPONGE) ×3 IMPLANT
STAPLER SKIN PROX WIDE 3.9 (STAPLE) ×1 IMPLANT
SUT ETHILON 2 0 FS 18 (SUTURE) IMPLANT
SUT MNCRL AB 3-0 PS2 18 (SUTURE) IMPLANT
SUT MNCRL AB 4-0 PS2 18 (SUTURE) IMPLANT
SUT VIC AB 3-0 PS1 18XBRD (SUTURE) IMPLANT
SYR BULB IRRIG 60ML STRL (SYRINGE) ×1 IMPLANT
SYR CONTROL 10ML LL (SYRINGE) ×1 IMPLANT
TOWEL GREEN STERILE FF (TOWEL DISPOSABLE) ×2 IMPLANT
TUBE CONNECTING 20X1/4 (TUBING) ×1 IMPLANT
UNDERPAD 30X36 HEAVY ABSORB (UNDERPADS AND DIAPERS) ×2 IMPLANT
YANKAUER SUCT BULB TIP NO VENT (SUCTIONS) ×1 IMPLANT

## 2024-08-01 NOTE — Anesthesia Procedure Notes (Signed)
 Procedure Name: Intubation Date/Time: 08/01/2024 7:46 AM  Performed by: Frost Kayla MATSU, CRNAPre-anesthesia Checklist: Patient identified, Emergency Drugs available, Suction available and Patient being monitored Patient Re-evaluated:Patient Re-evaluated prior to induction Oxygen Delivery Method: Circle system utilized Preoxygenation: Pre-oxygenation with 100% oxygen Induction Type: IV induction Ventilation: Mask ventilation without difficulty Laryngoscope Size: Miller and 3 Grade View: Grade II Tube type: Oral Tube size: 7.0 mm Number of attempts: 1 Airway Equipment and Method: Stylet and Oral airway Placement Confirmation: ETT inserted through vocal cords under direct vision, positive ETCO2 and breath sounds checked- equal and bilateral Secured at: 20 cm Tube secured with: Tape Dental Injury: Teeth and Oropharynx as per pre-operative assessment

## 2024-08-01 NOTE — Transfer of Care (Signed)
 Immediate Anesthesia Transfer of Care Note  Patient: Sylvia [name]  Procedure(s) Performed: MAMMOPLASTY, REDUCTION (Bilateral: Breast)  Patient Location: PACU  Anesthesia Type:General  Level of Consciousness: drowsy, patient cooperative, and responds to stimulation  Airway & Oxygen Therapy: Patient Spontanous Breathing and Patient connected to face mask oxygen  Post-op Assessment: Report given to RN and Post -op Vital signs reviewed and stable  Post vital signs: Reviewed and stable  Last Vitals:  Vitals Value Taken Time  BP    Temp    Pulse    Resp    SpO2      Last Pain:  Vitals:   08/01/24 0624  TempSrc: Temporal  PainSc: 0-No pain      Patients Stated Pain Goal: 0 (08/01/24 0624)  Complications: No notable events documented.

## 2024-08-01 NOTE — Anesthesia Preprocedure Evaluation (Addendum)
 Anesthesia Evaluation  Patient identified by MRN, date of birth, ID band Patient awake    Reviewed: Allergy & Precautions, NPO status , Patient's Chart, lab work & pertinent test results  History of Anesthesia Complications (+) PONV and history of anesthetic complications  Airway Mallampati: I  TM Distance: >3 FB Neck ROM: Full    Dental  (+) Teeth Intact, Dental Advisory Given   Pulmonary asthma , sleep apnea , former smoker   breath sounds clear to auscultation       Cardiovascular hypertension,  Rhythm:Regular Rate:Normal     Neuro/Psych  Headaches PSYCHIATRIC DISORDERS Anxiety Depression     Neuromuscular disease    GI/Hepatic Neg liver ROS, hiatal hernia,,,  Endo/Other  negative endocrine ROS    Renal/GU Renal disease     Musculoskeletal negative musculoskeletal ROS (+)    Abdominal   Peds  Hematology negative hematology ROS (+)   Anesthesia Other Findings   Reproductive/Obstetrics                              Anesthesia Physical Anesthesia Plan  ASA: 2  Anesthesia Plan: General   Post-op Pain Management: Tylenol  PO (pre-op)*   Induction: Intravenous  PONV Risk Score and Plan: 4 or greater and Ondansetron , Dexamethasone , Midazolam  and Scopolamine  patch - Pre-op  Airway Management Planned: Oral ETT  Additional Equipment: None  Intra-op Plan:   Post-operative Plan: Extubation in OR  Informed Consent: I have reviewed the patients History and Physical, chart, labs and discussed the procedure including the risks, benefits and alternatives for the proposed anesthesia with the patient or authorized representative who has indicated his/her understanding and acceptance.     Dental advisory given  Plan Discussed with: CRNA  Anesthesia Plan Comments:          Anesthesia Quick Evaluation

## 2024-08-01 NOTE — Discharge Instructions (Signed)
 No Tylenol  until 12:26pm today, if needed.

## 2024-08-01 NOTE — Interval H&P Note (Signed)
 History and Physical Interval Note:  08/01/2024 6:54 AM  Sylvia [name]  has presented today for surgery, with the diagnosis of Macromastia.  The various methods of treatment have been discussed with the patient and family. After consideration of risks, benefits and other options for treatment, the patient has consented to  Procedure(s): MAMMOPLASTY, REDUCTION (Bilateral) as a surgical intervention.  The patient's history has been reviewed, patient examined, no change in status, stable for surgery.  I have reviewed the patient's chart and labs.  Questions were answered to the patient's satisfaction.     Sylvia [name]

## 2024-08-01 NOTE — Op Note (Signed)
 Operative Note   DATE OF OPERATION: 9.9.2025  LOCATION: [facility]-outpatient  SURGICAL DIVISION: Plastic Surgery  PREOPERATIVE DIAGNOSES:  1. Macromastia 2. Chronic neck and back pain 3. Headaches  POSTOPERATIVE DIAGNOSES:  same  PROCEDURE:  Bilateral breast reduction  SURGEON: Earlis Ranks MD MBA  ASSISTANT: none  ANESTHESIA:  General.   EBL: 100 ml  COMPLICATIONS: None immediate.   INDICATIONS FOR PROCEDURE:  The patient, Sylvia [name], is a 58 y.o. female born on 09-28-1966, is here for treatment chronic neck and back pain HA in setting macromastia that has failed conservative measures.    FINDINGS: Right reduction 333 g Left reduction 333 g  DESCRIPTION OF PROCEDURE:  The patient was marked standing in the preoperative area to mark sternal notch, chest midline, anterior axillary lines, inframammary folds. The location of new nipple areolar complex was marked at level of on inframammary fold on anterior surface breast by palpation. This was marked symmetric over bilateral breasts. With aid of Wise pattern marker, location of new nipple areolar complex and vertical limbs (6 cm) were marked by displacement of breasts along meridian. The patient was taken to the operating room. SCDs were placed and IV antibiotics were given. The patient's operative site was prepped and draped in a sterile fashion. A time out was performed and all information was confirmed to be correct.     I began on left breast. Over left breast, superior medial pedicle marked and nipple areolar complex incised with 42 mm diameter marker. Pedicle deepithlialized and developed to chest wall. Pedicle developed to chest wall. Breast tissue resected over lower pole. Medial and lateral flaps developed. Additional superior and lateral breast tissue excised. Breast tailor tacked closed.   I then directed attention to right breast where superior medial pedicle designed. NAC incised with 42 mm diameter marker.  The pedicle was deepithelialized. Pedicle developed to chest wall. Breast tissue resected over lower pole. Medial and lateral flaps developed. Additional superior and lateral breast tissue excised. Breast tailor tacked closed. Patient assessed for symmetry. Breast cavities irrigated and hemostasis obtained. Local anesthetic infiltrated throughout each breast. 15 Fr JP placed in each breast and secured with 2-0 nylon. Closure completed bilateral with interrupted and short running 3-0 vicryl used to approximate dermis along remainder inframammary fold and vertical limb. NAC inset with 3-0 vicryl in dermis. Skin closure completed with 4-0 monocryl subcuticular throughout vertical limbs and NAC. Skin closure completed with 3-0 monocryl along each IMF. Tissue adhesive applied. Dry dressing and breast binder applied.  The patient was allowed to wake from anesthesia, extubated and taken to the recovery room in satisfactory condition.   SPECIMENS: right and left breast reduction  DRAINS: 15 Fr JP in right and left breast  Earlis Ranks, MD [facility] Plastic & Reconstructive Surgery  Office/ physician access line after hours [phone]

## 2024-08-01 NOTE — Anesthesia Postprocedure Evaluation (Signed)
 Anesthesia Post Note  Patient: Sylvia [name]  Procedure(s) Performed: MAMMOPLASTY, REDUCTION (Bilateral: Breast)     Patient location during evaluation: PACU Anesthesia Type: General Level of consciousness: awake and alert Pain management: pain level controlled Vital Signs Assessment: post-procedure vital signs reviewed and stable Respiratory status: spontaneous breathing, nonlabored ventilation, respiratory function stable and patient connected to nasal cannula oxygen Cardiovascular status: blood pressure returned to baseline and stable Postop Assessment: no apparent nausea or vomiting Anesthetic complications: no   No notable events documented.  Last Vitals:  Vitals:   08/01/24 1215 08/01/24 1245  BP: 97/72 109/72  Pulse: 85 77  Resp: 15 16  Temp:  36.8 C  SpO2: 99% 100%    Last Pain:  Vitals:   08/01/24 1245  TempSrc:   PainSc: 5                  [name]

## 2024-08-02 ENCOUNTER — Encounter (HOSPITAL_BASED_OUTPATIENT_CLINIC_OR_DEPARTMENT_OTHER): Payer: Self-pay | Admitting: Plastic Surgery

## 2024-08-04 ENCOUNTER — Ambulatory Visit

## 2024-08-07 LAB — SURGICAL PATHOLOGY

## 2024-08-08 DIAGNOSIS — I201 Angina pectoris with documented spasm: Secondary | ICD-10-CM | POA: Diagnosis not present

## 2024-08-08 DIAGNOSIS — T466X5A Adverse effect of antihyperlipidemic and antiarteriosclerotic drugs, initial encounter: Secondary | ICD-10-CM | POA: Diagnosis not present

## 2024-08-08 DIAGNOSIS — G453 Amaurosis fugax: Secondary | ICD-10-CM | POA: Diagnosis not present

## 2024-08-08 DIAGNOSIS — Z9889 Other specified postprocedural states: Secondary | ICD-10-CM | POA: Diagnosis not present

## 2024-08-08 DIAGNOSIS — M609 Myositis, unspecified: Secondary | ICD-10-CM | POA: Diagnosis not present

## 2024-08-08 DIAGNOSIS — E782 Mixed hyperlipidemia: Secondary | ICD-10-CM | POA: Diagnosis not present

## 2024-08-11 DIAGNOSIS — G43719 Chronic migraine without aura, intractable, without status migrainosus: Secondary | ICD-10-CM | POA: Diagnosis not present

## 2024-08-18 DIAGNOSIS — R3915 Urgency of urination: Secondary | ICD-10-CM | POA: Diagnosis not present

## 2024-08-18 DIAGNOSIS — R350 Frequency of micturition: Secondary | ICD-10-CM | POA: Diagnosis not present

## 2024-08-22 ENCOUNTER — Ambulatory Visit (INDEPENDENT_AMBULATORY_CARE_PROVIDER_SITE_OTHER): Admitting: Primary Care

## 2024-08-22 ENCOUNTER — Encounter: Payer: Self-pay | Admitting: Primary Care

## 2024-08-22 VITALS — BP 126/82 | HR 71 | Temp 97.6°F | Ht 60.0 in | Wt 159.6 lb

## 2024-08-22 DIAGNOSIS — G4700 Insomnia, unspecified: Secondary | ICD-10-CM

## 2024-08-22 DIAGNOSIS — Z8669 Personal history of other diseases of the nervous system and sense organs: Secondary | ICD-10-CM

## 2024-08-22 MED ORDER — ZOLPIDEM TARTRATE 10 MG PO TABS: ORAL_TABLET | ORAL | 0 refills | Status: AC

## 2024-08-22 NOTE — Patient Instructions (Signed)
 VISIT SUMMARY: Today, you were seen for a sleep consultation due to your history of mild sleep apnea and current issues with non-restorative sleep, daytime sleepiness, and fatigue. We discussed your previous difficulties with CPAP and oral appliance therapy, as well as your recent weight loss and other health conditions that impact your sleep. We also reviewed your history of TMJD, migraines, cervical fusion, and recent breast reduction surgery.  YOUR PLAN: -OBSTRUCTIVE SLEEP APNEA: Obstructive sleep apnea is a condition where your airway becomes blocked during sleep, causing breathing interruptions. We will order an in-lab sleep study to reassess the severity of your sleep apnea and prescribe Ambien to help you sleep during the study. We also discussed positional therapy and weight loss as conservative management options. If your sleep apnea is found to be moderate or severe, we may consider Inspire therapy as a treatment option.  -INSOMNIA: Insomnia is difficulty with falling or staying asleep. You have trouble maintaining sleep rather than falling asleep. We will prescribe Ambien for the night of your sleep study and discuss alternative sleep aids at follow-up if needed.   -MIGRAINE AND TEMPOROMANDIBULAR JOINT DISORDER (TMJD): Your TMJD and chronic migraines make it difficult to use oral appliance therapy for sleep apnea. Adjustments to the appliance have been unsuccessful and caused jaw discomfort.  INSTRUCTIONS: Please schedule an in-lab sleep study to reassess the severity of your sleep apnea. Take Ambien as prescribed on the night of the sleep study to ensure you sleep through the night. After the sleep study, we will discuss alternative sleep aids if needed. Continue with weight loss efforts and positional therapy as discussed. Follow up with us  after the sleep study to review the results and discuss further treatment options.  Orders: Polysomnography   Follow-up 2-3 months with Beth NP  after sleep study to review results and discuss treatment   Apne du sommeil Sleep Apnea  L'apne du sommeil est un problme de sant qui perturbe la respiration de la personne pendant qu'elle dort. La langue ou le tissu mou de la gorge peuvent bloquer le dbit d'air pendant le sommeil. La respiration peut tre superficielle ou s'arrter par moments. Les personnes atteintes d'apne du sommeil peuvent ronfler fort. Il existe trois types d'apne du sommeil : L'apne obstructive du sommeil. Ce type d'apne est provoqu par une obstruction ou un affaissement des voies respiratoires. C'est le type d'apne le plus courant. L'apne centrale du sommeil. Ce type d'apne se produit lorsque la partie du cerveau qui contrle la respiration n'envoie pas les bons signaux aux muscles qui contrlent la respiration. L'apne du sommeil mixte. Il s'agit d'une combinaison entre l'apne obstructive du sommeil et l'apne centrale du sommeil. Quelles sont les causes ? L'apne du sommeil est le plus souvent cause par un affaissement ou une obstruction des voies respiratoires. Quels sont les facteurs qui augmentent le risque ? Avoir un surpoids important. Avoir des membres de sa famille atteints d'apne du sommeil. Avoir une langue ou des amygdales plus grandes que la normale. Avoir de petites voies respiratoires ou des problmes de mchoire. tre g(e). Quels sont les signes ou symptmes ? Des ronflements trs forts. Un sommeil agit. Des difficults  rester endormi(e). Une somnolence ou une fatigue pendant la journe. Se rveiller en suffoquant ou en s'touffant. Avoir mal  la tte le matin. Avoir des [employer]. Avoir du mal  se souvenir des choses et  se Printmaker. Comment se fait le diagnostic ? Les antcdents mdicaux. Un examen physique. Une tude du sommeil. Cela s'appelle  galement une  polysomnographie . Cet examen est effectu dans un laboratoire du sommeil ou au domicile de la  personne pendant qu'elle dort. Comment cette affection est-elle traite ? Les traitements suivants pourront tre utiliss : [employer] claims handler. Perdre du poids, si la personne est en surpoids. Porter un appareil [employer] bouche. Il s'agit d'une pice buccale qui dplace la mchoire infrieure vers l'avant. Utiliser un appareil de pression positive des voies respiratoires (positive airway pressure, PAP) qui permet de [employer] voies respiratoires ouvertes pendant le sommeil, par exemple : Un appareil de pression positive continue des voies respiratoires (continuous positive airway pressure, CPAP). Cet appareil administre un jet d'air par le biais d'un masque lorsque la personne expire. Cela permet de maintenir les voies respiratoires ouvertes. Un appareil  deux niveaux de pression positive des voies respiratoires (bilevel positive airway pressure, BIPAP). Cet appareil administre un jet d'air par le biais d'un masque lorsque la personne inspire, puis expire, afin de maintenir ses voies respiratoires ouvertes. Subir une intervention chirurgicale si les autres traitements ne fonctionnent pas. Si l'apne du sommeil n'est pas traite, la personne pourrait prsenter un risque d'avoir : Une insuffisance cardiaque. Une crise cardiaque. Un accident vasculaire crbral. Un diabte de type 2 ou un problme de sucre sanguin appel  rsistance  l'insuline . Suivez les instructions suivantes  domicile : Consulting civil engineer vos mdicaments en suivant scrupuleusement les instructions de votre prestataire de soins de sant. vitez de consommer de l'alcool et de prendre des mdicaments pour vous aider  vous dtendre, ainsi que certains mdicaments contre la douleur. Ceux-ci peuvent aggraver l'apne du sommeil. Instructions gnrales Ne fumez pas et n'utilisez pas de vapoteuses ou de produits contenant de la nicotine ou du tabac. Si vous avez besoin d'aide pour arrter de fumer, Technical sales engineer. Si vous avez reu un appareil de PAP pour [employer] vos voies respiratoires ouvertes pendant votre sommeil, utilisez-le de la manire recommande par [employer]. Si vous devez subir une intervention chirurgicale, veillez  informer votre prestataire que vous tes atteint(e) d'apne du sommeil. Vous devrez peut-tre emporter votre appareil de PAP. Prenez contact avec un prestataire de soins de sant si : L'appareil de PAP qu'on vous a remis pour [employer] pendant votre sommeil vous gne ou semble ne pas fonctionner. Vous ne vous sentez pas mieux ou si vous vous sentez moins bien. Demandez immdiatement de l'aide si : Vous avez du mal  respirer. Vous ressentez des [employer]. Vous avez du mal  parler. Un ct de [employer]. Une partie de votre visage est pendante. Ces symptmes peuvent constituer une urgence mdicale. Appelez le 911 immdiatement. N'attendez pas de voir si les symptmes disparaissent. Ne prenez Event organiser, faites-vous accompagner  l'hpital. Ces conseils et renseignements ne sauraient se substituer  l'avis mdical de votre prestataire de soins de sant. Par consquent, il est primordial de parler de toutes vos proccupations avec votre prestataire de soins de sant. Document Revised: 01/27/2023 Document Reviewed: 01/27/2023 Elsevier Patient Education  2024 [employer].
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
## 2024-08-23 ENCOUNTER — Ambulatory Visit: Admitting: Primary Care

## 2024-08-25 DIAGNOSIS — R350 Frequency of micturition: Secondary | ICD-10-CM | POA: Diagnosis not present

## 2024-09-01 DIAGNOSIS — R350 Frequency of micturition: Secondary | ICD-10-CM | POA: Diagnosis not present

## 2024-09-13 DIAGNOSIS — J208 Acute bronchitis due to other specified organisms: Secondary | ICD-10-CM | POA: Diagnosis not present

## 2024-09-15 DIAGNOSIS — R350 Frequency of micturition: Secondary | ICD-10-CM | POA: Diagnosis not present

## 2024-09-21 DIAGNOSIS — R350 Frequency of micturition: Secondary | ICD-10-CM | POA: Diagnosis not present

## 2024-09-22 ENCOUNTER — Encounter: Payer: Self-pay | Admitting: Cardiology

## 2024-09-22 ENCOUNTER — Other Ambulatory Visit: Payer: Self-pay | Admitting: Cardiology

## 2024-09-22 DIAGNOSIS — I201 Angina pectoris with documented spasm: Secondary | ICD-10-CM

## 2024-09-26 ENCOUNTER — Ambulatory Visit: Admitting: Primary Care

## 2024-09-26 MED ORDER — LOSARTAN POTASSIUM 25 MG PO TABS: 25.0000 mg | ORAL_TABLET | Freq: Every day | ORAL | 1 refills | Status: AC

## 2024-09-28 DIAGNOSIS — R350 Frequency of micturition: Secondary | ICD-10-CM | POA: Diagnosis not present

## 2024-10-05 DIAGNOSIS — R350 Frequency of micturition: Secondary | ICD-10-CM | POA: Diagnosis not present

## 2024-10-12 DIAGNOSIS — R350 Frequency of micturition: Secondary | ICD-10-CM | POA: Diagnosis not present

## 2024-10-16 DIAGNOSIS — G43E19 Chronic migraine with aura, intractable, without status migrainosus: Secondary | ICD-10-CM | POA: Diagnosis not present

## 2024-10-16 DIAGNOSIS — Z91199 Patient's noncompliance with other medical treatment and regimen due to unspecified reason: Secondary | ICD-10-CM | POA: Diagnosis not present

## 2024-10-22 ENCOUNTER — Encounter (HOSPITAL_BASED_OUTPATIENT_CLINIC_OR_DEPARTMENT_OTHER): Admitting: Pulmonary Disease

## 2024-10-24 DIAGNOSIS — G43E19 Chronic migraine with aura, intractable, without status migrainosus: Secondary | ICD-10-CM | POA: Diagnosis not present

## 2024-10-26 DIAGNOSIS — R350 Frequency of micturition: Secondary | ICD-10-CM | POA: Diagnosis not present

## 2024-11-02 DIAGNOSIS — R350 Frequency of micturition: Secondary | ICD-10-CM | POA: Diagnosis not present

## 2024-11-07 ENCOUNTER — Ambulatory Visit: Admitting: Primary Care

## 2024-11-18 ENCOUNTER — Emergency Department (HOSPITAL_BASED_OUTPATIENT_CLINIC_OR_DEPARTMENT_OTHER)
Admission: EM | Admit: 2024-11-18 | Discharge: 2024-11-18 | Disposition: A | Discharge status: Home / Self Care | Attending: Emergency Medicine | Admitting: Emergency Medicine

## 2024-11-18 ENCOUNTER — Encounter (HOSPITAL_BASED_OUTPATIENT_CLINIC_OR_DEPARTMENT_OTHER): Payer: Self-pay | Admitting: Emergency Medicine

## 2024-11-18 ENCOUNTER — Emergency Department (HOSPITAL_BASED_OUTPATIENT_CLINIC_OR_DEPARTMENT_OTHER)

## 2024-11-18 DIAGNOSIS — M546 Pain in thoracic spine: Secondary | ICD-10-CM | POA: Diagnosis not present

## 2024-11-18 DIAGNOSIS — R10A1 Flank pain, right side: Secondary | ICD-10-CM | POA: Insufficient documentation

## 2024-11-18 DIAGNOSIS — M545 Low back pain, unspecified: Secondary | ICD-10-CM | POA: Insufficient documentation

## 2024-11-18 DIAGNOSIS — J45909 Unspecified asthma, uncomplicated: Secondary | ICD-10-CM | POA: Diagnosis not present

## 2024-11-18 DIAGNOSIS — M549 Dorsalgia, unspecified: Secondary | ICD-10-CM

## 2024-11-18 DIAGNOSIS — I10 Essential (primary) hypertension: Secondary | ICD-10-CM | POA: Diagnosis not present

## 2024-11-18 DIAGNOSIS — Z79899 Other long term (current) drug therapy: Secondary | ICD-10-CM | POA: Insufficient documentation

## 2024-11-18 LAB — COMPREHENSIVE METABOLIC PANEL WITH GFR
ALT: 18 U/L (ref 0–44)
AST: 22 U/L (ref 15–41)
Albumin: 4.3 g/dL (ref 3.5–5.0)
Alkaline Phosphatase: 81 U/L (ref 38–126)
Anion gap: 10 (ref 5–15)
BUN: 9 mg/dL (ref 6–20)
CO2: 24 mmol/L (ref 22–32)
Calcium: 9.7 mg/dL (ref 8.9–10.3)
Chloride: 105 mmol/L (ref 98–111)
Creatinine, Ser: 0.9 mg/dL (ref 0.44–1.00)
GFR, Estimated: >60 mL/min
Glucose, Bld: 126 mg/dL — ABNORMAL HIGH (ref 70–99)
Potassium: 4.4 mmol/L (ref 3.5–5.1)
Sodium: 138 mmol/L (ref 135–145)
Total Bilirubin: 1.5 mg/dL — ABNORMAL HIGH (ref 0.0–1.2)
Total Protein: 6.5 g/dL (ref 6.5–8.1)

## 2024-11-18 LAB — CBC
HCT: 40.6 % (ref 36.0–46.0)
Hemoglobin: 13.9 g/dL (ref 12.0–15.0)
MCH: 28.4 pg (ref 26.0–34.0)
MCHC: 34.2 g/dL (ref 30.0–36.0)
MCV: 82.9 fL (ref 80.0–100.0)
Platelets: 210 K/uL (ref 150–400)
RBC: 4.9 MIL/uL (ref 3.87–5.11)
RDW: 12.1 % (ref 11.5–15.5)
WBC: 4.5 K/uL (ref 4.0–10.5)
nRBC: 0 % (ref 0.0–0.2)

## 2024-11-18 LAB — URINALYSIS, ROUTINE W REFLEX MICROSCOPIC
Bilirubin Urine: NEGATIVE
Glucose, UA: NEGATIVE mg/dL
Hgb urine dipstick: NEGATIVE
Ketones, ur: NEGATIVE mg/dL
Leukocytes,Ua: NEGATIVE
Nitrite: NEGATIVE
Protein, ur: NEGATIVE mg/dL
Specific Gravity, Urine: 1.009 (ref 1.005–1.030)
pH: 6.5 (ref 5.0–8.0)

## 2024-11-18 LAB — LIPASE, BLOOD: Lipase: 31 U/L (ref 11–51)

## 2024-11-18 MED ORDER — LIDOCAINE 5 % EX PTCH: 1.0000 | MEDICATED_PATCH | CUTANEOUS | 0 refills | Status: AC

## 2024-11-18 MED ORDER — METHOCARBAMOL 500 MG PO TABS: 500.0000 mg | ORAL_TABLET | Freq: Two times a day (BID) | ORAL | 0 refills | Status: AC
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
## 2024-11-18 NOTE — ED Notes (Signed)
 Return from CT

## 2024-11-18 NOTE — ED Triage Notes (Signed)
 Hx kidney, donated kidney in past Now having similar right side pain Though it was a pulled muscle, but is leaving soon for vacation so wants to get it checked out.

## 2024-11-18 NOTE — Discharge Instructions (Addendum)
 Your kidney function was fantastic.  Your CT scan does not show a kidney stone.  I think this is musculoskeletal back pain.  I recommend taking 1000 mg of Tylenol  every 8 hours, you can apply a lidocaine  patch to the area.  I have also sent you muscle relaxers.  Do not drive or operate machinery if you are taking this medicine.

## 2024-12-24 ENCOUNTER — Ambulatory Visit (HOSPITAL_BASED_OUTPATIENT_CLINIC_OR_DEPARTMENT_OTHER): Admitting: Pulmonary Disease

## 2024-12-28 ENCOUNTER — Ambulatory Visit: Admitting: Primary Care

## 2024-12-30 ENCOUNTER — Ambulatory Visit (HOSPITAL_BASED_OUTPATIENT_CLINIC_OR_DEPARTMENT_OTHER): Admitting: Pulmonary Disease

## 2025-01-11 ENCOUNTER — Ambulatory Visit: Admitting: Cardiology
